# Patient Record
Sex: Female | Born: 1955 | Race: Black or African American | Hispanic: No | Marital: Married | Smoking: Never smoker
Health system: Southern US, Community
[De-identification: ages and names within clinical notes are randomized; demographics above are authoritative.]

## PROBLEM LIST (undated history)

## (undated) DIAGNOSIS — I671 Cerebral aneurysm, nonruptured: Secondary | ICD-10-CM

## (undated) DIAGNOSIS — I10 Essential (primary) hypertension: Secondary | ICD-10-CM

## (undated) DIAGNOSIS — C569 Malignant neoplasm of unspecified ovary: Secondary | ICD-10-CM

## (undated) HISTORY — PX: CHOLECYSTECTOMY: SHX55

## (undated) HISTORY — PX: CEREBRAL ANEURYSM REPAIR: SHX164

## (undated) HISTORY — PX: ABDOMINAL HYSTERECTOMY: SHX81

## (undated) HISTORY — PX: APPENDECTOMY: SHX54

---

## 2018-07-27 ENCOUNTER — Emergency Department (HOSPITAL_BASED_OUTPATIENT_CLINIC_OR_DEPARTMENT_OTHER)
Admission: EM | Admit: 2018-07-27 | Discharge: 2018-07-27 | Disposition: A | Discharge status: Home / Self Care | Payer: BLUE CROSS/BLUE SHIELD | Attending: Emergency Medicine | Admitting: Emergency Medicine

## 2018-07-27 ENCOUNTER — Emergency Department (HOSPITAL_BASED_OUTPATIENT_CLINIC_OR_DEPARTMENT_OTHER): Payer: BLUE CROSS/BLUE SHIELD

## 2018-07-27 ENCOUNTER — Encounter (HOSPITAL_BASED_OUTPATIENT_CLINIC_OR_DEPARTMENT_OTHER): Payer: Self-pay | Admitting: Emergency Medicine

## 2018-07-27 ENCOUNTER — Other Ambulatory Visit: Payer: Self-pay

## 2018-07-27 DIAGNOSIS — Z91010 Allergy to peanuts: Secondary | ICD-10-CM | POA: Insufficient documentation

## 2018-07-27 DIAGNOSIS — R519 Headache, unspecified: Secondary | ICD-10-CM

## 2018-07-27 DIAGNOSIS — I10 Essential (primary) hypertension: Secondary | ICD-10-CM | POA: Insufficient documentation

## 2018-07-27 DIAGNOSIS — Z8543 Personal history of malignant neoplasm of ovary: Secondary | ICD-10-CM | POA: Diagnosis not present

## 2018-07-27 DIAGNOSIS — R51 Headache: Secondary | ICD-10-CM | POA: Diagnosis not present

## 2018-07-27 HISTORY — DX: Cerebral aneurysm, nonruptured: I67.1

## 2018-07-27 HISTORY — DX: Malignant neoplasm of unspecified ovary: C56.9

## 2018-07-27 HISTORY — DX: Essential (primary) hypertension: I10

## 2018-07-27 LAB — BASIC METABOLIC PANEL
Anion gap: 8 (ref 5–15)
BUN: 12 mg/dL (ref 8–23)
CO2: 30 mmol/L (ref 22–32)
Calcium: 9 mg/dL (ref 8.9–10.3)
Chloride: 100 mmol/L (ref 98–111)
Creatinine, Ser: 0.66 mg/dL (ref 0.44–1.00)
GFR calc Af Amer: >60 mL/min (ref >60)
GFR calc non Af Amer: >60 mL/min (ref >60)
Glucose, Bld: 111 mg/dL — ABNORMAL HIGH (ref 70–99)
Potassium: 3.3 mmol/L — ABNORMAL LOW (ref 3.5–5.1)
Sodium: 138 mmol/L (ref 135–145)

## 2018-07-27 LAB — CBC WITH DIFFERENTIAL/PLATELET
Abs Immature Granulocytes: 0.01 10*3/uL (ref 0.00–0.07)
Basophils Absolute: 0.1 10*3/uL (ref 0.0–0.1)
Basophils Relative: 1 %
Eosinophils Absolute: 0.1 10*3/uL (ref 0.0–0.5)
Eosinophils Relative: 2 %
HCT: 41.9 % (ref 36.0–46.0)
Hemoglobin: 13.5 g/dL (ref 12.0–15.0)
Immature Granulocytes: 0 %
Lymphocytes Relative: 45 %
Lymphs Abs: 1.7 10*3/uL (ref 0.7–4.0)
MCH: 26.9 pg (ref 26.0–34.0)
MCHC: 32.2 g/dL (ref 30.0–36.0)
MCV: 83.5 fL (ref 80.0–100.0)
Monocytes Absolute: 0.7 10*3/uL (ref 0.1–1.0)
Monocytes Relative: 17 %
Neutro Abs: 1.3 10*3/uL — ABNORMAL LOW (ref 1.7–7.7)
Neutrophils Relative %: 35 %
Platelets: 251 10*3/uL (ref 150–400)
RBC: 5.02 MIL/uL (ref 3.87–5.11)
RDW: 14.1 % (ref 11.5–15.5)
WBC: 3.8 10*3/uL — ABNORMAL LOW (ref 4.0–10.5)
nRBC: 0 % (ref 0.0–0.2)

## 2018-07-27 MED ORDER — BUTALBITAL-APAP-CAFFEINE 50-325-40 MG PO TABS: 1.0000 | ORAL_TABLET | ORAL | 0 refills | Status: AC | PRN

## 2018-07-27 MED ORDER — SODIUM CHLORIDE 0.9 % IV BOLUS
1000.0000 mL | Freq: Once | INTRAVENOUS | Status: AC
  Administered 2018-07-27: 1000 mL via INTRAVENOUS

## 2018-07-27 MED ORDER — IOPAMIDOL (ISOVUE-370) INJECTION 76%
100.0000 mL | Freq: Once | INTRAVENOUS | Status: AC | PRN
  Administered 2018-07-27: 100 mL via INTRAVENOUS

## 2018-07-27 MED ORDER — KETOROLAC TROMETHAMINE 15 MG/ML IJ SOLN
15.0000 mg | Freq: Once | INTRAMUSCULAR | Status: AC
  Administered 2018-07-27: 15 mg via INTRAVENOUS
  Filled 2018-07-27: qty 1

## 2018-07-27 MED ORDER — DIPHENHYDRAMINE HCL 50 MG/ML IJ SOLN
25.0000 mg | Freq: Once | INTRAMUSCULAR | Status: AC
  Administered 2018-07-27: 25 mg via INTRAVENOUS
  Filled 2018-07-27: qty 1

## 2018-07-27 MED ORDER — METOCLOPRAMIDE HCL 5 MG/ML IJ SOLN
10.0000 mg | Freq: Once | INTRAMUSCULAR | Status: AC
  Administered 2018-07-27: 10 mg via INTRAVENOUS
  Filled 2018-07-27: qty 2

## 2018-07-27 NOTE — ED Triage Notes (Signed)
Pt c/o headache for past 1-2 months. Pt has appointment with neurosurgeon in February. Pt ran out of her pain medication 6 days ago.

## 2018-07-27 NOTE — ED Notes (Signed)
ED Provider at bedside. 

## 2018-07-27 NOTE — Discharge Instructions (Addendum)
Your CT scan today does not show any concerns findings. You received IV contrast so we could specifically evaluate your blood vessels because of your past history of cerebral aneurysm. I want you to follow-up as previously scheduled. Take prescribed medication as needed for further headaches.

## 2018-08-04 NOTE — ED Provider Notes (Signed)
[facility] Provider Note   CSN: [id number] Arrival date & time: 07/27/18  1056     History   Chief Complaint No chief complaint on file.   HPI Amber Grimes is a 63 y.o. female.  HPI   63 year old female with headache.  Gradual onset over a month ago.  Denies any acute trauma or strain.  Headache is diffuse.  Past history of aneurysm status post clipping years ago.  No acute visual changes.  No acute numbness, tingling focal loss of strength.  Reports upcoming appointment neurosurgery.  Out of her pain medication approximately a week ago.  Past Medical History:  Diagnosis Date  . Brain aneurysm   . Hypertension   . Ovarian cancer ([facility])     There are no active problems to display for this patient.   Past Surgical History:  Procedure Laterality Date  . ABDOMINAL HYSTERECTOMY    . CHOLECYSTECTOMY       OB History   No obstetric history on file.      Home Medications    Prior to Admission medications   Medication Sig Start Date End Date Taking? Authorizing Provider  butalbital-acetaminophen-caffeine (FIORICET, [facility]) [phone] MG tablet Take 1 tablet by mouth every 4 (four) hours as needed for headache. 07/27/18 07/27/19  Virgel Manifold, MD    Family History No family history on file.  Social History Social History   Tobacco Use  . Smoking status: Never Smoker  . Smokeless tobacco: Never Used  Substance Use Topics  . Alcohol use: Never    Frequency: Never  . Drug use: Never     Allergies   Peanut-containing drug products   Review of Systems Review of Systems  All systems reviewed and negative, other than as noted in HPI.  Physical Exam Updated Vital Signs BP 111/61 (BP Location: Right Arm)   Pulse 78   Temp 98.7 F (37.1 C) (Oral)   Resp 18   Ht 5\' 2"  (1.575 m)   Wt 70.3 kg   SpO2 100%   BMI 28.35 kg/m   Physical Exam Vitals signs and nursing note reviewed.  Constitutional:      General: She is not  in acute distress.    Appearance: She is well-developed.  HENT:     Head: Normocephalic and atraumatic.     Nose: Nose normal.  Eyes:     General:        Right eye: No discharge.        Left eye: No discharge.     Conjunctiva/sclera: Conjunctivae normal.     Pupils: Pupils are equal, round, and reactive to light.  Neck:     Musculoskeletal: Neck supple.  Cardiovascular:     Rate and Rhythm: Normal rate and regular rhythm.     Heart sounds: Normal heart sounds. No murmur. No friction rub. No gallop.   Pulmonary:     Effort: Pulmonary effort is normal. No respiratory distress.     Breath sounds: Normal breath sounds.  Abdominal:     General: There is no distension.     Palpations: Abdomen is soft.     Tenderness: There is no abdominal tenderness.  Musculoskeletal:        General: No tenderness.  Skin:    General: Skin is warm and dry.  Neurological:     Mental Status: She is alert and oriented to person, place, and time.     Cranial Nerves: No cranial nerve deficit.  Sensory: No sensory deficit.     Motor: No weakness.     Coordination: Coordination normal.  Psychiatric:        Behavior: Behavior normal.        Thought Content: Thought content normal.      ED Treatments / Results  Labs (all labs ordered are listed, but only abnormal results are displayed) Labs Reviewed  CBC WITH DIFFERENTIAL/PLATELET - Abnormal; Notable for the following components:      Result Value   WBC 3.8 (*)    Neutro Abs 1.3 (*)    All other components within normal limits  BASIC METABOLIC PANEL - Abnormal; Notable for the following components:   Potassium 3.3 (*)    Glucose, Bld 111 (*)    All other components within normal limits    EKG None  Radiology No results found.   Ct Angio Head W Or Wo Contrast  Result Date: 07/27/2018 CLINICAL DATA:  Headache 1 month. History of aneurysm clipping and coiling EXAM: CT ANGIOGRAPHY HEAD TECHNIQUE: Multidetector CT imaging of the head was  performed using the standard protocol during bolus administration of intravenous contrast. Multiplanar CT image reconstructions and MIPs were obtained to evaluate the vascular anatomy. CONTRAST:  174mL ISOVUE-370 IOPAMIDOL (ISOVUE-370) INJECTION 76% COMPARISON:  None. FINDINGS: CT HEAD Brain: Ventricle size normal. Small hypodensity left internal capsule anteriorly and left frontotemporal lobe appears to be due to chronic infarction. Negative for acute infarct, hemorrhage, mass. No midline shift. Vascular: Negative for hyperdense vessel. Aneurysm clip left MCA bifurcation. Aneurysm coiling right posterior communicating artery region. Skull: Left temporal craniotomy.  No acute skeletal abnormality Sinuses: Mucosal edema paranasal sinuses. Right ethmoidectomy and medial antrostomy. Extensive bony thickening of the right maxillary sinus. Air-fluid level right sphenoid sinus. Mucosal edema in the paranasal sinuses. Mastoid sinus clear bilaterally. Orbits: Negative CTA HEAD Anterior circulation: Right cavernous carotid widely patent. Aneurysm coil pack in good position in the right posterior communicating artery region. 3 mm aneurysm medial to the coil pack. Right anterior and middle cerebral arteries widely patent. Right posterior communicating artery patent. Left cavernous carotid widely patent without aneurysm or stenosis. Left anterior and middle cerebral arteries patent. Aneurysm clip in the left MCA bifurcation without recurrent aneurysm. Left posterior communicating artery patent. Posterior circulation: Right vertebral dominant. Small left vertebral artery. Both vertebral arteries contribute to the basilar. PICA patent bilaterally. Basilar widely patent. Posterior cerebral arteries patent bilaterally. No aneurysm in the posterior circulation. Venous sinuses: Negative Anatomic variants: None Delayed phase: Normal enhancement on delayed imaging. IMPRESSION: 1. Negative for subarachnoid hemorrhage. 2. 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 No acute infarct. 3. Aneurysm coiling right posterior communicating artery. Residual/recurrent 3 mm aneurysm medial to the coil pack 4. Clipping of left MCA bifurcation without recurrent aneurysm. Electronically Signed   By: Franchot Gallo M.D.   On: 07/27/2018 14:36    Procedures Procedures (including critical care time)  Medications Ordered in ED Medications  sodium chloride 0.9 % bolus 1,000 mL (0 mLs Intravenous Stopped 07/27/18 1456)  metoCLOPramide (REGLAN) injection 10 mg (10 mg Intravenous Given 07/27/18 1326)  diphenhydrAMINE (BENADRYL) injection 25 mg (25 mg Intravenous Given 07/27/18 1326)  ketorolac (TORADOL) 15 MG/ML injection 15 mg (15 mg Intravenous Given 07/27/18 1326)  iopamidol (ISOVUE-370) 76 % injection 100 mL (100 mLs Intravenous Contrast Given 07/27/18 1349)     Initial Impression / Assessment and Plan / ED Course  I have reviewed  the triage vital signs and the nursing notes.  Pertinent labs & imaging results that were available during my care of the patient were reviewed by me and considered in my medical decision making (see chart for details).     Headache which started become chronic in nature.  Afebrile.  Neuro exam is nonfocal. Suspect primary HA. Consider emergent secondary causes such as bleed, infectious or mass but doubt.  She of aneurysm, but CT today without evidence of acute process.  There is no history of trauma. Pt has a nonfocal neurological exam. Afebrile and neck supple. No use of blood thinning medication. Consider ocular etiology such as acute angle closure glaucoma but doubt. Pt denies acute change in visual acuity and eye exam unremarkable. Doubt temporal arteritis. No temporal tenderness and temporal artery pulsations palpable. Doubt CO poisoning. No contacts with similar symptoms. Doubt venous thrombosis. Doubt carotid or vertebral arteries dissection.  Symptoms improved with meds. Feel that can be safely discharged, but strict return precautions discussed. Outpt fu.   Final Clinical Impressions(s) / ED Diagnoses   Final diagnoses:  Nonintractable headache, unspecified chronicity pattern, unspecified headache type    ED Discharge Orders         Ordered    butalbital-acetaminophen-caffeine (FIORICET, [facility]) 50-325-40 MG tablet  Every 4 hours PRN     07/27/18 1450           Virgel Manifold, MD 08/04/18 [phone]

## 2020-04-07 ENCOUNTER — Other Ambulatory Visit: Payer: Self-pay

## 2020-04-07 ENCOUNTER — Emergency Department (HOSPITAL_BASED_OUTPATIENT_CLINIC_OR_DEPARTMENT_OTHER): Payer: BC Managed Care – PPO

## 2020-04-07 ENCOUNTER — Inpatient Hospital Stay (HOSPITAL_BASED_OUTPATIENT_CLINIC_OR_DEPARTMENT_OTHER)
Admission: EM | Admit: 2020-04-07 | Discharge: 2020-04-11 | DRG: 177 | Disposition: A | Discharge status: Home / Self Care | Payer: BC Managed Care – PPO | Attending: Internal Medicine | Admitting: Internal Medicine

## 2020-04-07 ENCOUNTER — Encounter (HOSPITAL_BASED_OUTPATIENT_CLINIC_OR_DEPARTMENT_OTHER): Payer: Self-pay | Admitting: *Deleted

## 2020-04-07 DIAGNOSIS — Z90710 Acquired absence of both cervix and uterus: Secondary | ICD-10-CM

## 2020-04-07 DIAGNOSIS — Z90722 Acquired absence of ovaries, bilateral: Secondary | ICD-10-CM

## 2020-04-07 DIAGNOSIS — J1282 Pneumonia due to coronavirus disease 2019: Secondary | ICD-10-CM | POA: Diagnosis present

## 2020-04-07 DIAGNOSIS — Z7951 Long term (current) use of inhaled steroids: Secondary | ICD-10-CM

## 2020-04-07 DIAGNOSIS — U071 COVID-19: Secondary | ICD-10-CM | POA: Diagnosis not present

## 2020-04-07 DIAGNOSIS — I10 Essential (primary) hypertension: Secondary | ICD-10-CM | POA: Diagnosis present

## 2020-04-07 DIAGNOSIS — Z79899 Other long term (current) drug therapy: Secondary | ICD-10-CM

## 2020-04-07 DIAGNOSIS — Z8249 Family history of ischemic heart disease and other diseases of the circulatory system: Secondary | ICD-10-CM

## 2020-04-07 DIAGNOSIS — A4189 Other specified sepsis: Secondary | ICD-10-CM | POA: Diagnosis present

## 2020-04-07 DIAGNOSIS — E876 Hypokalemia: Secondary | ICD-10-CM | POA: Diagnosis present

## 2020-04-07 DIAGNOSIS — Z8543 Personal history of malignant neoplasm of ovary: Secondary | ICD-10-CM

## 2020-04-07 DIAGNOSIS — J9601 Acute respiratory failure with hypoxia: Secondary | ICD-10-CM | POA: Diagnosis present

## 2020-04-07 DIAGNOSIS — Z91010 Allergy to peanuts: Secondary | ICD-10-CM

## 2020-04-07 LAB — RESPIRATORY PANEL BY RT PCR (FLU A&B, COVID)
Influenza A by PCR: NEGATIVE
Influenza B by PCR: NEGATIVE
SARS Coronavirus 2 by RT PCR: POSITIVE — AB

## 2020-04-07 LAB — CBC WITH DIFFERENTIAL/PLATELET
Abs Immature Granulocytes: 0.12 10*3/uL — ABNORMAL HIGH (ref 0.00–0.07)
Basophils Absolute: 0 10*3/uL (ref 0.0–0.1)
Basophils Relative: 0 %
Eosinophils Absolute: 0.2 10*3/uL (ref 0.0–0.5)
Eosinophils Relative: 2 %
HCT: 42.4 % (ref 36.0–46.0)
Hemoglobin: 14.1 g/dL (ref 12.0–15.0)
Immature Granulocytes: 1 %
Lymphocytes Relative: 12 %
Lymphs Abs: 1.4 10*3/uL (ref 0.7–4.0)
MCH: 27.4 pg (ref 26.0–34.0)
MCHC: 33.3 g/dL (ref 30.0–36.0)
MCV: 82.5 fL (ref 80.0–100.0)
Monocytes Absolute: 0.7 10*3/uL (ref 0.1–1.0)
Monocytes Relative: 6 %
Neutro Abs: 9.2 10*3/uL — ABNORMAL HIGH (ref 1.7–7.7)
Neutrophils Relative %: 79 %
Platelets: 678 10*3/uL — ABNORMAL HIGH (ref 150–400)
RBC: 5.14 MIL/uL — ABNORMAL HIGH (ref 3.87–5.11)
RDW: 13.4 % (ref 11.5–15.5)
WBC: 11.6 10*3/uL — ABNORMAL HIGH (ref 4.0–10.5)
nRBC: 0 % (ref 0.0–0.2)

## 2020-04-07 LAB — C-REACTIVE PROTEIN: CRP: 19.8 mg/dL — ABNORMAL HIGH (ref <1.0)

## 2020-04-07 LAB — LACTIC ACID, PLASMA: Lactic Acid, Venous: 1.6 mmol/L (ref 0.5–1.9)

## 2020-04-07 LAB — COMPREHENSIVE METABOLIC PANEL
ALT: 70 U/L — ABNORMAL HIGH (ref 0–44)
AST: 53 U/L — ABNORMAL HIGH (ref 15–41)
Albumin: 3.5 g/dL (ref 3.5–5.0)
Alkaline Phosphatase: 80 U/L (ref 38–126)
Anion gap: 16 — ABNORMAL HIGH (ref 5–15)
BUN: 16 mg/dL (ref 8–23)
CO2: 27 mmol/L (ref 22–32)
Calcium: 9.3 mg/dL (ref 8.9–10.3)
Chloride: 102 mmol/L (ref 98–111)
Creatinine, Ser: 0.72 mg/dL (ref 0.44–1.00)
GFR calc Af Amer: >60 mL/min (ref >60)
GFR calc non Af Amer: >60 mL/min (ref >60)
Glucose, Bld: 125 mg/dL — ABNORMAL HIGH (ref 70–99)
Potassium: 2.9 mmol/L — ABNORMAL LOW (ref 3.5–5.1)
Sodium: 145 mmol/L (ref 135–145)
Total Bilirubin: 1.3 mg/dL — ABNORMAL HIGH (ref 0.3–1.2)
Total Protein: 7.6 g/dL (ref 6.5–8.1)

## 2020-04-07 LAB — D-DIMER, QUANTITATIVE: D-Dimer, Quant: 3.95 ug/mL-FEU — ABNORMAL HIGH (ref 0.00–0.50)

## 2020-04-07 LAB — TRIGLYCERIDES: Triglycerides: 114 mg/dL (ref <150)

## 2020-04-07 LAB — PROCALCITONIN: Procalcitonin: <0.1 ng/mL

## 2020-04-07 LAB — FIBRINOGEN: Fibrinogen: >800 mg/dL — ABNORMAL HIGH (ref 210–475)

## 2020-04-07 LAB — FERRITIN: Ferritin: 1511 ng/mL — ABNORMAL HIGH (ref 11–307)

## 2020-04-07 LAB — LACTATE DEHYDROGENASE: LDH: 535 U/L — ABNORMAL HIGH (ref 98–192)

## 2020-04-07 MED ORDER — POTASSIUM CHLORIDE 10 MEQ/100ML IV SOLN
10.0000 meq | Freq: Once | INTRAVENOUS | Status: AC
  Administered 2020-04-07: 10 meq via INTRAVENOUS
  Filled 2020-04-07: qty 100

## 2020-04-07 MED ORDER — SODIUM CHLORIDE 0.9 % IV SOLN
100.0000 mg | Freq: Once | INTRAVENOUS | Status: AC
  Administered 2020-04-07: 100 mg via INTRAVENOUS
  Filled 2020-04-07: qty 20

## 2020-04-07 MED ORDER — SODIUM CHLORIDE 0.9 % IV SOLN
100.0000 mg | Freq: Every day | INTRAVENOUS | Status: AC
  Administered 2020-04-08 – 2020-04-11 (×4): 100 mg via INTRAVENOUS
  Filled 2020-04-07 (×4): qty 20

## 2020-04-07 MED ORDER — DEXAMETHASONE SODIUM PHOSPHATE 10 MG/ML IJ SOLN
6.0000 mg | Freq: Once | INTRAMUSCULAR | Status: AC
  Administered 2020-04-07: 6 mg via INTRAVENOUS
  Filled 2020-04-07: qty 1

## 2020-04-07 MED ORDER — IOHEXOL 350 MG/ML SOLN
100.0000 mL | Freq: Once | INTRAVENOUS | Status: AC | PRN
  Administered 2020-04-07: 100 mL via INTRAVENOUS

## 2020-04-07 MED ORDER — POTASSIUM CHLORIDE CRYS ER 20 MEQ PO TBCR
40.0000 meq | EXTENDED_RELEASE_TABLET | Freq: Once | ORAL | Status: AC
  Administered 2020-04-07: 40 meq via ORAL
  Filled 2020-04-07: qty 2

## 2020-04-07 NOTE — ED Notes (Signed)
Attempted to Obtain EKG, Waiting for assessments to be complete per RN

## 2020-04-07 NOTE — ED Triage Notes (Signed)
Covid Positive. She was seen by her MD and was told she needed to come to the ED with "blood poisoning". She had blood work and a CXR.

## 2020-04-07 NOTE — ED Notes (Signed)
Pt was placed on oxygen at 4l/m to wait for a room. Her oxygen sats at 94% on oxygen.

## 2020-04-07 NOTE — ED Notes (Signed)
ED Provider at bedside. 

## 2020-04-07 NOTE — ED Notes (Signed)
Unable to get sufficient blood return on second IV for second blood culture. EDP notified.

## 2020-04-07 NOTE — ED Provider Notes (Signed)
[facility]PARTMENT Provider Note   CSN: [id number] Arrival date & time: 04/07/20  1150     History Chief Complaint  Patient presents with  . Shortness of Breath    COVID+    Kimla Furth is a 64 y.o. female past history of brain injury, hypertension, ovarian evaluation of shortness of breath and abnormal lab work.  She reports that she has had symptoms since 03/26/2020.  On 04/02/20, she was evaluated at urgent care and was Covid positive.  Patient reports that she was having some worsening shortness of breath that she checked and followed up with her primary care doctor today.  She reports that they did blood work and called her and told her to go to the emergency department because "she had blood poisoning."  She states that she does not know what that means.  She states that over the last 4 days, she has shortness of breath.  She states that she does not move and stay still, she feels better but as soon as she starts moving around, she starts having worsening trouble breathing.  She does not have any history of asthma or COPD.  Denies any inhaler use.  She does not smoke.  She states she is not been having any chest pain.  She does report that she has had decreased appetite and has not been wanting to eat or drink anything.  She is on a vomiting, abdominal pain. She denies any OCP use, recent immobilization, prior history of DVT/PE, recent surgery, leg swelling, or long travel.  The history is provided by the patient.       Past Medical History:  Diagnosis Date  . Brain aneurysm   . Hypertension   . Ovarian cancer [facility])     Patient Active Problem List   Diagnosis Date Noted  . COVID-19 04/07/2020    Past Surgical History:  Procedure Laterality Date  . ABDOMINAL HYSTERECTOMY    . CHOLECYSTECTOMY       OB History   No obstetric history on file.     No family history on file.  Social History   Tobacco Use  . Smoking status: Never Smoker  .  Smokeless tobacco: Never Used  Vaping Use  . Vaping Use: Never used  Substance Use Topics  . Alcohol use: Never  . Drug use: Never    Home Medications Prior to Admission medications   Medication Sig Start Date End Date Taking? Authorizing Provider  albuterol (PROVENTIL) (2.5 MG/3ML) 0.083% nebulizer solution Inhale into the lungs. 04/07/20 04/21/20 Yes [provider]  benzonatate (TESSALON) 200 MG capsule Take by mouth. 04/07/20 04/21/20 Yes [provider]  budesonide (PULMICORT) 1 MG/2ML nebulizer solution Inhale into the lungs. 04/07/20 04/21/20 Yes [provider]  clotrimazole (MYCELEX) 10 MG troche Take by mouth. 04/07/20 04/14/20 Yes [provider]  dexamethasone (DECADRON) 6 MG tablet Take by mouth. 04/02/20 04/12/20 Yes [provider]  dextromethorphan-guaiFENesin (MUCINEX DM) 30-600 MG 12hr tablet Take by mouth. 04/07/20 04/21/20 Yes [provider]  furosemide (LASIX) 40 MG tablet TAKE 1 TABLET(40 MG) BY MOUTH DAILY AS NEEDED 02/19/17  Yes [provider]  HYDROcodone-homatropine (HYCODAN) 5-1.5 MG/5ML syrup Take by mouth. 04/07/20 04/17/20 Yes [provider]  losartan (COZAAR) 100 MG tablet TAKE 1 TABLET(100 MG) BY MOUTH DAILY 02/19/17  Yes [provider]  montelukast (SINGULAIR) 10 MG tablet Take by mouth. 04/07/20 04/21/20 Yes [provider]  Nebulizers (AEROECLIPSE II NEBULIZER) MISC See admin instructions.  04/07/20  Yes [provider]    Allergies    Peanut-containing drug products  Review of Systems   Review of Systems  Constitutional: Positive for appetite change and fatigue. Negative for fever.  Respiratory: Positive for cough and shortness of breath.   Cardiovascular: Negative for chest pain and leg swelling.  Gastrointestinal: Negative for abdominal pain, nausea and vomiting.  Genitourinary: Negative for dysuria and hematuria.  Neurological: Negative for headaches.  All  other systems reviewed and are negative.   Physical Exam Updated Vital Signs BP 133/73   Pulse 66   Temp 98.6 F (37 C) (Oral)   Resp (!) 22   Ht 5\' 2"  (1.575 m)   Wt 72.6 kg   SpO2 100%   BMI 29.26 kg/m   Physical Exam Vitals and nursing note reviewed.  Constitutional:      Appearance: Normal appearance. She is well-developed. She is ill-appearing.  HENT:     Head: Normocephalic and atraumatic.  Eyes:     General: Lids are normal.     Conjunctiva/sclera: Conjunctivae normal.     Pupils: Pupils are equal, round, and reactive to light.  Cardiovascular:     Rate and Rhythm: Normal rate and regular rhythm.     Pulses: Normal pulses.     Heart sounds: Normal heart sounds. No murmur heard.  No friction rub. No gallop.   Pulmonary:     Effort: Tachypnea present.     Comments: Tachypnea and increased work of breathing noted.  Patient speaking in medium sentences.  Lungs show diffuse rales noted throughout all lung bases.  No obvious wheezing. Abdominal:     Palpations: Abdomen is soft. Abdomen is not rigid.     Tenderness: There is no abdominal tenderness. There is no guarding.     Comments: Abdomen is soft, non-distended, non-tender. No rigidity, No guarding. No peritoneal signs.  Musculoskeletal:        General: Normal range of motion.     Cervical back: Full passive range of motion without pain.     Comments: BLE are symmetric in appearance without any overlying warmth, erythema, or edema.   Skin:    General: Skin is warm and dry.     Capillary Refill: Capillary refill takes less than 2 seconds.  Neurological:     Mental Status: She is alert and oriented to person, place, and time.  Psychiatric:        Speech: Speech normal.     ED Results / Procedures / Treatments   Labs (all labs ordered are listed, but only abnormal results are displayed) Labs Reviewed  RESPIRATORY PANEL BY RT PCR (FLU A&B, COVID) - Abnormal; Notable for the following components:      Result  Value   SARS Coronavirus 2 by RT PCR POSITIVE (*)    All other components within normal limits  CBC WITH DIFFERENTIAL/PLATELET - Abnormal; Notable for the following components:   WBC 11.6 (*)    RBC 5.14 (*)    Platelets 678 (*)    Neutro Abs 9.2 (*)    Abs Immature Granulocytes 0.12 (*)    All other components within normal limits  COMPREHENSIVE METABOLIC PANEL - Abnormal; Notable for the following components:   Potassium 2.9 (*)    Glucose, Bld 125 (*)    AST 53 (*)    ALT 70 (*)    Total Bilirubin 1.3 (*)    Anion gap 16 (*)    All other components within normal limits  D-DIMER, QUANTITATIVE (NOT AT [facility]) - Abnormal; Notable for the following components:   D-Dimer, Quant 3.95 (*)    All other components within normal limits  LACTATE DEHYDROGENASE - Abnormal; Notable for the following components:   LDH 535 (*)    All other components within normal limits  FERRITIN - Abnormal; Notable for the following components:   Ferritin 1,511 (*)    All other components within normal limits  FIBRINOGEN - Abnormal; Notable for the following components:   Fibrinogen >800 (*)    All other components within normal limits  C-REACTIVE PROTEIN - Abnormal; Notable for the following components:   CRP 19.8 (*)    All other components within normal limits  CULTURE, BLOOD (ROUTINE X 2)  CULTURE, BLOOD (ROUTINE X 2)  LACTIC ACID, PLASMA  PROCALCITONIN  TRIGLYCERIDES    EKG None  Radiology CT Angio Chest PE W and/or Wo Contrast  Result Date: 04/07/2020 CLINICAL DATA:  Pulmonary embolus suspected. COVID positive with shortness of breath and positive D-dimer. EXAM: CT ANGIOGRAPHY CHEST WITH CONTRAST TECHNIQUE: Multidetector CT imaging of the chest was performed using the standard protocol during bolus administration of intravenous contrast. Multiplanar CT image reconstructions and MIPs were obtained to evaluate the vascular anatomy. CONTRAST:  149mL OMNIPAQUE IOHEXOL 350 MG/ML SOLN COMPARISON:   Chest x-ray 04/07/2020. FINDINGS: Cardiovascular: Satisfactory opacification of the pulmonary arteries to the segmental level. No evidence of pulmonary embolism. The main pulmonary artery is normal in caliber. Foci of gas within the main pulmonary artery likely related to injection of intravenous contrast. Normal heart size. Trace pericardial effusion. The thoracic aorta is normal in caliber. Mild aorta atherosclerotic calcification. Mediastinum/Nodes: Prominent borderline enlarged hilar lymph nodes likely reactive. No definite enlarged mediastinal lymph node. No enlarged axillary lymph nodes. Thyroid gland, trachea, and esophagus demonstrate no significant findings. Lungs/Pleura: Diffuse scattered ground-glass airspace opacities that are more consolidative within the lower lung zones. No definite pulmonary mass within the aerated portions of the lungs. Upper Abdomen: No acute abnormality. Musculoskeletal: No chest wall abnormality No suspicious lytic or blastic osseous lesions. No acute displaced fracture. Multilevel degenerative changes of the spine. Review of the MIP images confirms the above findings. IMPRESSION: 1. No pulmonary embolus. 2. Diffuse multifocal pneumonia consistent with known COVID 19 infection. 3. Other imaging findings of potential clinical significance: Likely reactive hilar lymph nodes. Trace pericardial effusion. Aortic Atherosclerosis (ICD10-I70.0). Electronically Signed   By: Iven Finn M.D.   On: 04/07/2020 15:47   DG Chest Port 1 View  Result Date: 04/07/2020 CLINICAL DATA:  Shortness of breath and cough.  COVID-19 positive EXAM: PORTABLE CHEST 1 VIEW COMPARISON:  April 07, 2020 study obtained earlier in the day FINDINGS: There is airspace opacity in each mid and lower lung region, most notable in the bases. No new opacity evident. Heart size and pulmonary vascularity are normal. No adenopathy. No bone lesions. IMPRESSION: Multifocal opacity bilaterally consistent with  atypical organism pneumonia. No new opacity compared to earlier in the day. Cardiac silhouette within normal limits. Electronically Signed   By: Lowella Grip III M.D.   On: 04/07/2020 13:52    Procedures .Critical Care Performed by: Volanda Napoleon, PA-C Authorized by: Volanda Napoleon, PA-C   Critical care provider statement:    Critical care time (minutes):  35   Critical care was necessary to treat or prevent imminent or life-threatening deterioration of the following conditions:  Respiratory failure   Critical care was time spent personally by me on the following  activities:  Discussions with consultants, evaluation of patient's response to treatment, examination of patient, ordering and performing treatments and interventions, ordering and review of laboratory studies, ordering and review of radiographic studies, pulse oximetry, re-evaluation of patient's condition, obtaining history from patient or surrogate and review of old charts   (including critical care time)  Medications Ordered in ED Medications  remdesivir 100 mg in sodium chloride 0.9 % 100 mL IVPB (has no administration in time range)  dexamethasone (DECADRON) injection 6 mg (6 mg Intravenous Given 04/07/20 1407)  iohexol (OMNIPAQUE) 350 MG/ML injection 100 mL (100 mLs Intravenous Contrast Given 04/07/20 1511)  potassium chloride 10 mEq in 100 mL IVPB (0 mEq Intravenous Stopped 04/07/20 1729)  remdesivir 100 mg in sodium chloride 0.9 % 100 mL IVPB (0 mg Intravenous Stopped 04/07/20 1743)    Followed by  remdesivir 100 mg in sodium chloride 0.9 % 100 mL IVPB (0 mg Intravenous Stopped 04/07/20 1815)  potassium chloride SA (KLOR-CON) CR tablet 40 mEq (40 mEq Oral Given 04/07/20 1713)    ED Course  I have reviewed the triage vital signs and the nursing notes.  Pertinent labs & imaging results that were available during my care of the patient were reviewed by me and considered in my medical decision making (see chart for  details).    MDM Rules/Calculators/A&P                          64 year old female approximately her brain aneurysm, hypertension, ovarian cancer presents for evaluation of abnormal blood work, shortness of breath.  Diagnosed with Covid on 04/02/20.  Reports she had been having some worsening shortness of breath.  Follow-up with her PCP and was called today and told that she had "blood poisoning" and was told to come to emergency department.  She reports her last 4 days, shortness of breath has worsened.  On initially arrival, she is afebrile but was hypoxic with O2 sats of 85% on room air.  She was placed on 5 L which brought her up to 94%.  During my evaluation, I turned the oxygen off and just talking and sitting on the bed, she dropped back down to 85.  Patient be placed back on 5 L of O2.  She has no oxygen requirement at home.  She does have diffuse rales noted in all lung fields.  No wheezing.  We will plan repeat chest x-ray, blood work.  I reviewed the blood work from her primary care doctor this morning.  She had low potassium as well as an elevated D-dimer.  I suspect this is the abnormalities he was referring  Chest x-ray shows multifocal pneumonia.  CMP potassium of 2.9.  BUN/creatinine within normal limits.  D-dimer is positive.  CBC shows slight leukocytosis.  Lactic is within normal limits.  Patient is Covid positive here in the ED.  Given her positive D-dimer, plan for CTA.  Patient given repletion for potassium.  CTA shows no pulmonary embolism.  There is diffuse multifocal pneumonia consistent with known COVID-19 infection.  Given patient's hypoxia here in the ED, will plan for admission.  Patient started on Decadron, and does severe.  Discussed patient with Dr. Roosevelt Locks [facility]) who accepts patient for admission.   Updated patient on plan. She is agreeable.   Updated patient's husband Mellina Benison on plan. He can be reached at [phone].   Myan Locatelli was  evaluated in Emergency Department on 04/07/2020 for the symptoms described in  the history of present illness. She was evaluated in the context of the global COVID-19 pandemic, which necessitated consideration that the patient might be at risk for infection with the SARS-CoV-2 virus that causes COVID-19. Institutional protocols and algorithms that pertain to the evaluation of patients at risk for COVID-19 are in a state of rapid change based on information released by regulatory bodies including the CDC and federal and state organizations. These policies and algorithms were followed during the patient's care in the ED.  Portions of this note were generated with [employer]. Dictation errors may occur despite best attempts at proofreading.   Final Clinical Impression(s) / ED Diagnoses Final diagnoses:  COVID-19  Pneumonia due to COVID-19 virus  Hypokalemia    Rx / DC Orders ED Discharge Orders    None       Volanda Napoleon, PA-C 04/07/20 1859    Maudie Flakes, MD 04/08/20 [phone]

## 2020-04-08 ENCOUNTER — Encounter (HOSPITAL_COMMUNITY): Payer: Self-pay | Admitting: Internal Medicine

## 2020-04-08 DIAGNOSIS — Z79899 Other long term (current) drug therapy: Secondary | ICD-10-CM | POA: Diagnosis not present

## 2020-04-08 DIAGNOSIS — Z90722 Acquired absence of ovaries, bilateral: Secondary | ICD-10-CM | POA: Diagnosis not present

## 2020-04-08 DIAGNOSIS — Z7951 Long term (current) use of inhaled steroids: Secondary | ICD-10-CM | POA: Diagnosis not present

## 2020-04-08 DIAGNOSIS — E876 Hypokalemia: Secondary | ICD-10-CM

## 2020-04-08 DIAGNOSIS — Z8543 Personal history of malignant neoplasm of ovary: Secondary | ICD-10-CM | POA: Diagnosis not present

## 2020-04-08 DIAGNOSIS — U071 COVID-19: Principal | ICD-10-CM

## 2020-04-08 DIAGNOSIS — J9601 Acute respiratory failure with hypoxia: Secondary | ICD-10-CM

## 2020-04-08 DIAGNOSIS — A4189 Other specified sepsis: Secondary | ICD-10-CM | POA: Diagnosis present

## 2020-04-08 DIAGNOSIS — J1282 Pneumonia due to coronavirus disease 2019: Secondary | ICD-10-CM

## 2020-04-08 DIAGNOSIS — Z90710 Acquired absence of both cervix and uterus: Secondary | ICD-10-CM | POA: Diagnosis not present

## 2020-04-08 DIAGNOSIS — Z8249 Family history of ischemic heart disease and other diseases of the circulatory system: Secondary | ICD-10-CM | POA: Diagnosis not present

## 2020-04-08 DIAGNOSIS — I10 Essential (primary) hypertension: Secondary | ICD-10-CM | POA: Diagnosis present

## 2020-04-08 DIAGNOSIS — Z91010 Allergy to peanuts: Secondary | ICD-10-CM | POA: Diagnosis not present

## 2020-04-08 LAB — COMPREHENSIVE METABOLIC PANEL
ALT: 60 U/L — ABNORMAL HIGH (ref 0–44)
AST: 37 U/L (ref 15–41)
Albumin: 3.5 g/dL (ref 3.5–5.0)
Alkaline Phosphatase: 79 U/L (ref 38–126)
Anion gap: 14 (ref 5–15)
BUN: 16 mg/dL (ref 8–23)
CO2: 21 mmol/L — ABNORMAL LOW (ref 22–32)
Calcium: 8.9 mg/dL (ref 8.9–10.3)
Chloride: 108 mmol/L (ref 98–111)
Creatinine, Ser: 0.62 mg/dL (ref 0.44–1.00)
GFR calc Af Amer: >60 mL/min (ref >60)
GFR calc non Af Amer: >60 mL/min (ref >60)
Glucose, Bld: 107 mg/dL — ABNORMAL HIGH (ref 70–99)
Potassium: 3.4 mmol/L — ABNORMAL LOW (ref 3.5–5.1)
Sodium: 143 mmol/L (ref 135–145)
Total Bilirubin: 1.1 mg/dL (ref 0.3–1.2)
Total Protein: 7 g/dL (ref 6.5–8.1)

## 2020-04-08 LAB — CBC WITH DIFFERENTIAL/PLATELET
Abs Immature Granulocytes: 0.13 10*3/uL — ABNORMAL HIGH (ref 0.00–0.07)
Basophils Absolute: 0 10*3/uL (ref 0.0–0.1)
Basophils Relative: 0 %
Eosinophils Absolute: 0 10*3/uL (ref 0.0–0.5)
Eosinophils Relative: 0 %
HCT: 41 % (ref 36.0–46.0)
Hemoglobin: 13.4 g/dL (ref 12.0–15.0)
Immature Granulocytes: 1 %
Lymphocytes Relative: 13 %
Lymphs Abs: 1.5 10*3/uL (ref 0.7–4.0)
MCH: 27.6 pg (ref 26.0–34.0)
MCHC: 32.7 g/dL (ref 30.0–36.0)
MCV: 84.4 fL (ref 80.0–100.0)
Monocytes Absolute: 0.7 10*3/uL (ref 0.1–1.0)
Monocytes Relative: 6 %
Neutro Abs: 9.1 10*3/uL — ABNORMAL HIGH (ref 1.7–7.7)
Neutrophils Relative %: 80 %
Platelets: 586 10*3/uL — ABNORMAL HIGH (ref 150–400)
RBC: 4.86 MIL/uL (ref 3.87–5.11)
RDW: 13.8 % (ref 11.5–15.5)
WBC: 11.4 10*3/uL — ABNORMAL HIGH (ref 4.0–10.5)
nRBC: 0 % (ref 0.0–0.2)

## 2020-04-08 LAB — HIV ANTIBODY (ROUTINE TESTING W REFLEX): HIV Screen 4th Generation wRfx: NONREACTIVE

## 2020-04-08 LAB — C-REACTIVE PROTEIN: CRP: 13.5 mg/dL — ABNORMAL HIGH (ref <1.0)

## 2020-04-08 LAB — D-DIMER, QUANTITATIVE: D-Dimer, Quant: 3.57 ug/mL-FEU — ABNORMAL HIGH (ref 0.00–0.50)

## 2020-04-08 MED ORDER — BUDESONIDE 0.5 MG/2ML IN SUSP: 1.0000 mg | Freq: Every day | RESPIRATORY_TRACT | Status: DC | PRN

## 2020-04-08 MED ORDER — BENZONATATE 100 MG PO CAPS
200.0000 mg | ORAL_CAPSULE | Freq: Two times a day (BID) | ORAL | Status: DC | PRN
  Administered 2020-04-08: 200 mg via ORAL
  Filled 2020-04-08: qty 2

## 2020-04-08 MED ORDER — ENOXAPARIN SODIUM 40 MG/0.4ML ~~LOC~~ SOLN
40.0000 mg | SUBCUTANEOUS | Status: DC
  Administered 2020-04-08 – 2020-04-10 (×3): 40 mg via SUBCUTANEOUS
  Filled 2020-04-08 (×4): qty 0.4

## 2020-04-08 MED ORDER — ONDANSETRON HCL 4 MG PO TABS: 4.0000 mg | ORAL_TABLET | Freq: Four times a day (QID) | ORAL | Status: DC | PRN

## 2020-04-08 MED ORDER — METHYLPREDNISOLONE SODIUM SUCC 40 MG IJ SOLR
0.5000 mg/kg | Freq: Two times a day (BID) | INTRAMUSCULAR | Status: AC
  Administered 2020-04-08 – 2020-04-10 (×6): 36.4 mg via INTRAVENOUS
  Filled 2020-04-08 (×6): qty 1

## 2020-04-08 MED ORDER — GUAIFENESIN-DM 100-10 MG/5ML PO SYRP
10.0000 mL | ORAL_SOLUTION | ORAL | Status: DC | PRN
  Administered 2020-04-10: 10 mL via ORAL
  Filled 2020-04-08: qty 10

## 2020-04-08 MED ORDER — FLUTICASONE PROPIONATE HFA 220 MCG/ACT IN AERO
1.0000 | INHALATION_SPRAY | Freq: Two times a day (BID) | RESPIRATORY_TRACT | Status: DC | PRN
  Filled 2020-04-08: qty 12

## 2020-04-08 MED ORDER — ACETAMINOPHEN 325 MG PO TABS
650.0000 mg | ORAL_TABLET | Freq: Four times a day (QID) | ORAL | Status: DC | PRN
  Administered 2020-04-08: 650 mg via ORAL
  Filled 2020-04-08: qty 2

## 2020-04-08 MED ORDER — LOPERAMIDE HCL 2 MG PO CAPS: 2.0000 mg | ORAL_CAPSULE | ORAL | Status: DC | PRN

## 2020-04-08 MED ORDER — ONDANSETRON HCL 4 MG/2ML IJ SOLN: 4.0000 mg | Freq: Four times a day (QID) | INTRAMUSCULAR | Status: DC | PRN

## 2020-04-08 MED ORDER — ALBUTEROL SULFATE (2.5 MG/3ML) 0.083% IN NEBU: 2.5000 mg | INHALATION_SOLUTION | RESPIRATORY_TRACT | Status: DC | PRN

## 2020-04-08 MED ORDER — POTASSIUM CHLORIDE CRYS ER 20 MEQ PO TBCR
20.0000 meq | EXTENDED_RELEASE_TABLET | Freq: Two times a day (BID) | ORAL | Status: AC
  Administered 2020-04-08 – 2020-04-09 (×4): 20 meq via ORAL
  Filled 2020-04-08 (×4): qty 1

## 2020-04-08 MED ORDER — HYDROCOD POLST-CPM POLST ER 10-8 MG/5ML PO SUER: 5.0000 mL | Freq: Two times a day (BID) | ORAL | Status: DC | PRN

## 2020-04-08 MED ORDER — ALBUTEROL SULFATE HFA 108 (90 BASE) MCG/ACT IN AERS
1.0000 | INHALATION_SPRAY | RESPIRATORY_TRACT | Status: DC | PRN
  Filled 2020-04-08: qty 6.7

## 2020-04-08 MED ORDER — PREDNISONE 20 MG PO TABS
50.0000 mg | ORAL_TABLET | Freq: Every day | ORAL | Status: DC
  Administered 2020-04-11: 50 mg via ORAL
  Filled 2020-04-08: qty 2

## 2020-04-08 MED ORDER — SODIUM CHLORIDE 0.9 % IV SOLN
INTRAVENOUS | Status: DC | PRN
  Administered 2020-04-08: 250 mL via INTRAVENOUS

## 2020-04-08 MED ORDER — LOSARTAN POTASSIUM 50 MG PO TABS
100.0000 mg | ORAL_TABLET | Freq: Every day | ORAL | Status: DC
  Administered 2020-04-08 – 2020-04-11 (×4): 100 mg via ORAL
  Filled 2020-04-08 (×4): qty 2

## 2020-04-08 NOTE — Plan of Care (Signed)
Plan of care discussed.   

## 2020-04-08 NOTE — H&P (Signed)
History and Physical    Amber Grimes TIW:[id number] DOB: 26-Jun-1956 DOA: 04/07/2020  PCP: Elisabeth Cara, PA-C  Patient coming from: Home  I have personally briefly reviewed patient's old medical records in [place]  Chief Complaint: COVID  HPI: Amber Grimes is a 64 y.o. female with medical history significant of HTN, brain aneurysm s/p clipping, ovarian CA s/p TAH/BSO in remission.  Pt presents to ED with c/o symptoms of SOB, cough.  Symptoms onset 9/18.  Seen at [facility] 9/25, diagnosed with COVID.  Symptoms continued to progressively worsen so she came in to ED today.  SOB worse with activity.  No fever or chills.  Does have poor PO intake.  No h/o DVT/PE.   ED Course: CTA chest neg for PE, does show multifocal COVID PNA.  Pt with new 4L O2 requirement.  Started on remdesivir and decadron and sent to [facility] for admission.   Review of Systems: As per HPI, otherwise all review of systems negative.  Past Medical History:  Diagnosis Date   Brain aneurysm    Hypertension    Ovarian cancer [facility])     Past Surgical History:  Procedure Laterality Date   ABDOMINAL HYSTERECTOMY     Laproscopic radical with node biopsy   APPENDECTOMY     CEREBRAL ANEURYSM REPAIR     CHOLECYSTECTOMY       reports that she has never smoked. She has never used smokeless tobacco. She reports that she does not drink alcohol and does not use drugs.  Allergies  Allergen Reactions   Other Rash   Peanut Oil Rash   Peanut-Containing Drug Products Rash    Family History  Problem Relation Age of Onset   Hypertension Mother    Cancer Father    Hypertension Father    Glaucoma Neg Hx    Macular degeneration Neg Hx      Prior to Admission medications   Medication Sig Start Date End Date Taking? Authorizing Provider  albuterol (PROVENTIL) (2.5 MG/3ML) 0.083% nebulizer solution Inhale 2.5 mg into the lungs every 4 (four) hours as needed for wheezing or shortness of  breath.  04/07/20 04/21/20 Yes [provider]  benzonatate (TESSALON) 200 MG capsule Take 200 mg by mouth 2 (two) times daily as needed for cough.  04/07/20 04/21/20 Yes [provider]  budesonide (PULMICORT) 1 MG/2ML nebulizer solution Inhale 1 mg into the lungs daily as needed (sob).  04/07/20 04/21/20 Yes [provider]  furosemide (LASIX) 40 MG tablet Take 40 mg by mouth daily as needed for fluid.  02/19/17  Yes [provider]  losartan (COZAAR) 100 MG tablet Take 100 mg by mouth daily.  02/19/17  Yes [provider]  Nebulizers (AEROECLIPSE II NEBULIZER) MISC See admin instructions. 04/07/20  Yes [provider]    Physical Exam: Vitals:   04/07/20 2230 04/07/20 2300 04/08/20 0026 04/08/20 0227  BP: (!) 156/71 135/73 (!) 159/79 (!) 146/70  Pulse: 74 70 83 65  Resp: 19 (!) 26 (!) 28 20  Temp:   98.2 F (36.8 C) 97.9 F (36.6 C)  TempSrc:      SpO2: 93% 94% 97% 92%  Weight:      Height:        Constitutional: NAD, calm, comfortable Eyes: PERRL, lids and conjunctivae normal ENMT: Mucous membranes are moist. Posterior pharynx clear of any exudate or lesions.Normal dentition.  Neck: normal, supple, no masses, no thyromegaly Respiratory: clear to auscultation bilaterally, no wheezing, no crackles. Normal  respiratory effort. No accessory muscle use.  Cardiovascular: Regular rate and rhythm, no murmurs / rubs / gallops. No extremity edema. 2+ pedal pulses. No carotid bruits.  Abdomen: no tenderness, no masses palpated. No hepatosplenomegaly. Bowel sounds positive.  Musculoskeletal: no clubbing / cyanosis. No joint deformity upper and lower extremities. Good ROM, no contractures. Normal muscle tone.  Skin: no rashes, lesions, ulcers. No induration Neurologic: CN 2-12 grossly intact. Sensation intact, DTR normal. Strength 5/5 in all 4.  Psychiatric: Normal judgment and insight. Alert and oriented x 3. Normal mood.    Labs on  Admission: I have personally reviewed following labs and imaging studies  CBC: Recent Labs  Lab 04/07/20 1405  WBC 11.6*  NEUTROABS 9.2*  HGB 14.1  HCT 42.4  MCV 82.5  PLT 161*   Basic Metabolic Panel: Recent Labs  Lab 04/07/20 1405  NA 145  K 2.9*  CL 102  CO2 27  GLUCOSE 125*  BUN 16  CREATININE 0.72  CALCIUM 9.3   GFR: Estimated Creatinine Clearance: 67.2 mL/min (by C-G formula based on SCr of 0.72 mg/dL). Liver Function Tests: Recent Labs  Lab 04/07/20 1405  AST 53*  ALT 70*  ALKPHOS 80  BILITOT 1.3*  PROT 7.6  ALBUMIN 3.5   No results for input(s): LIPASE, AMYLASE in the last 168 hours. No results for input(s): AMMONIA in the last 168 hours. Coagulation Profile: No results for input(s): INR, PROTIME in the last 168 hours. Cardiac Enzymes: No results for input(s): CKTOTAL, CKMB, CKMBINDEX, TROPONINI in the last 168 hours. BNP (last 3 results) No results for input(s): PROBNP in the last 8760 hours. HbA1C: No results for input(s): HGBA1C in the last 72 hours. CBG: No results for input(s): GLUCAP in the last 168 hours. Lipid Profile: Recent Labs    04/07/20 1405  TRIG 114   Thyroid Function Tests: No results for input(s): TSH, T4TOTAL, FREET4, T3FREE, THYROIDAB in the last 72 hours. Anemia Panel: Recent Labs    04/07/20 1405  FERRITIN 1,511*   Urine analysis: No results found for: COLORURINE, APPEARANCEUR, LABSPEC, PHURINE, GLUCOSEU, HGBUR, BILIRUBINUR, KETONESUR, PROTEINUR, UROBILINOGEN, NITRITE, LEUKOCYTESUR  Radiological Exams on Admission: CT Angio Chest PE W and/or Wo Contrast  Result Date: 04/07/2020 CLINICAL DATA:  Pulmonary embolus suspected. COVID positive with shortness of breath and positive D-dimer. EXAM: CT ANGIOGRAPHY CHEST WITH CONTRAST TECHNIQUE: Multidetector CT imaging of the chest was performed using the standard protocol during bolus administration of intravenous contrast. Multiplanar CT image reconstructions and MIPs were  obtained to evaluate the vascular anatomy. CONTRAST:  161mL OMNIPAQUE IOHEXOL 350 MG/ML SOLN COMPARISON:  Chest x-ray 04/07/2020. FINDINGS: Cardiovascular: Satisfactory opacification of the pulmonary arteries to the segmental level. No evidence of pulmonary embolism. The main pulmonary artery is normal in caliber. Foci of gas within the main pulmonary artery likely related to injection of intravenous contrast. Normal heart size. Trace pericardial effusion. The thoracic aorta is normal in caliber. Mild aorta atherosclerotic calcification. Mediastinum/Nodes: Prominent borderline enlarged hilar lymph nodes likely reactive. No definite enlarged mediastinal lymph node. No enlarged axillary lymph nodes. Thyroid gland, trachea, and esophagus demonstrate no significant findings. Lungs/Pleura: Diffuse scattered ground-glass airspace opacities that are more consolidative within the lower lung zones. No definite pulmonary mass within the aerated portions of the lungs. Upper Abdomen: No acute abnormality. Musculoskeletal: No chest wall abnormality No suspicious lytic or blastic osseous lesions. No acute displaced fracture. Multilevel degenerative changes of the spine. Review of the MIP images confirms the above findings. IMPRESSION: 1. No  pulmonary embolus. 2. Diffuse multifocal pneumonia consistent with known COVID 19 infection. 3. Other imaging findings of potential clinical significance: Likely reactive hilar lymph nodes. Trace pericardial effusion. Aortic Atherosclerosis (ICD10-I70.0). Electronically Signed   By: Iven Finn M.D.   On: 04/07/2020 15:47   DG Chest Port 1 View  Result Date: 04/07/2020 CLINICAL DATA:  Shortness of breath and cough.  COVID-19 positive EXAM: PORTABLE CHEST 1 VIEW COMPARISON:  April 07, 2020 study obtained earlier in the day FINDINGS: There is airspace opacity in each mid and lower lung region, most notable in the bases. No new opacity evident. Heart size and pulmonary vascularity are  normal. No adenopathy. No bone lesions. IMPRESSION: Multifocal opacity bilaterally consistent with atypical organism pneumonia. No new opacity compared to earlier in the day. Cardiac silhouette within normal limits. Electronically Signed   By: Lowella Grip III M.D.   On: 04/07/2020 13:52    EKG: Independently reviewed.  Assessment/Plan Principal Problem:   Acute hypoxemic respiratory failure due to COVID-19 [facility]) Active Problems:   HTN (hypertension)    1. COVID-19 with new O2 requirement - 1. COVID pathway 2. remdesivir 3. Solumedrol 4. Daily labs 5. Replacing K, repeat in AM 6. immodium for diarrhea 7. Cont pulse ox 8. Cough suppression 2. HTN - 1. Cont losartan 2. Holding lasix  DVT prophylaxis: Lovenox Code Status: Full Family Communication: no family in room Disposition Plan: Home after O2 requirement improved Consults called: none Admission status: Admit to inpatient  Severity of Illness: The appropriate patient status for this patient is INPATIENT. Inpatient status is judged to be reasonable and necessary in order to provide the required intensity of service to ensure the patient's safety. The patient's presenting symptoms, physical exam findings, and initial radiographic and laboratory data in the context of their chronic comorbidities is felt to place them at high risk for further clinical deterioration. Furthermore, it is not anticipated that the patient will be medically stable for discharge from the hospital within 2 midnights of admission. The following factors support the patient status of inpatient.   IP status due to new O2 requirement with COVID-19 PNA.   * I certify that at the point of admission it is my clinical judgment that the patient will require inpatient hospital care spanning beyond 2 midnights from the point of admission due to high intensity of service, high risk for further deterioration and high frequency of surveillance  required.*    Chrislyn Seedorf M. DO [facility]  How to contact the [facility] Attending or Consulting provider [place] or covering provider during after hours [place], for this patient?  1. Check the care team in [facility] and look for a) attending/consulting [facility] provider listed and b) the [facility] team listed 2. Log into [email or web address]  Amion Physician Scheduling and messaging for groups and whole hospitals  On call and physician scheduling software for group practices, residents, hospitalists and other medical providers for call, clinic, rotation and shift schedules. On[employer] is a hospital-wide system for scheduling doctors and paging doctors on call. EasyPlot is for scientific plotting and data analysis.  [email or web address]  and use [place]'s universal password to access. If you do not have the password, please contact the hospital operator.  3. Locate the [facility] provider you are looking for under [facility] and page to a number that you can be directly reached. 4. If you still have difficulty reaching the provider, please page the [facility] (Director on Call) for the Hospitalists listed on  amion for assistance.  04/08/2020, 2:54 AM

## 2020-04-08 NOTE — Progress Notes (Signed)
Patient seen and examined.  Admitted by nighttime hospitalist early morning hours with symptomatic for more than 1 week.  Positive test on 9/25.  She feels slightly better than on presentation.  Not mobilized yet.  Continue IV steroids and remdesivir therapy.  Start mobilizing.

## 2020-04-08 NOTE — Progress Notes (Signed)
   04/08/20 0026  Assess: MEWS Score  Temp 98.2 F (36.8 C)  BP (!) 159/79  Pulse Rate 83  Resp (!) 28  Level of Consciousness Alert  SpO2 97 %  O2 Device Nasal Cannula  Assess: MEWS Score  MEWS Temp 0  MEWS Systolic 0  MEWS Pulse 0  MEWS RR 2  MEWS LOC 0  MEWS Score 2  MEWS Score Color Yellow  Assess: if the MEWS score is Yellow or Red  Were vital signs taken at a resting state? Yes  Focused Assessment No change from prior assessment  MEWS guidelines implemented *See Row Information* No, vital signs rechecked  Treat  Pain Scale 0-10  Pain Score 0  Multiple Pain Sites No  Notify: Provider  Provider Name/Title Dr Alcario Drought  Date Provider Notified 04/08/20  Time Provider Notified [phone]  Notification Type Page (when reporting arrival to floor)  Notification Reason Other (Comment) (new admit)  Response See new orders  Date of Provider Response 04/08/20  Time of Provider Response  (Came to assess patient for admission)  Document  Patient Outcome Stabilized after interventions

## 2020-04-08 NOTE — Progress Notes (Signed)
Reports on admission she has not eaten well in last 5 days and has had diarrhea.  Denies nausea.  Noted low K+ .  Will request imodium when seen by MD. Oriented to room and use of call button.  Agrees to report changes in condition. Restful at this time.

## 2020-04-09 LAB — CBC WITH DIFFERENTIAL/PLATELET
Abs Immature Granulocytes: 0.15 10*3/uL — ABNORMAL HIGH (ref 0.00–0.07)
Basophils Absolute: 0 10*3/uL (ref 0.0–0.1)
Basophils Relative: 0 %
Eosinophils Absolute: 0 10*3/uL (ref 0.0–0.5)
Eosinophils Relative: 0 %
HCT: 37.5 % (ref 36.0–46.0)
Hemoglobin: 12.4 g/dL (ref 12.0–15.0)
Immature Granulocytes: 1 %
Lymphocytes Relative: 11 %
Lymphs Abs: 1.9 10*3/uL (ref 0.7–4.0)
MCH: 27.9 pg (ref 26.0–34.0)
MCHC: 33.1 g/dL (ref 30.0–36.0)
MCV: 84.5 fL (ref 80.0–100.0)
Monocytes Absolute: 1 10*3/uL (ref 0.1–1.0)
Monocytes Relative: 6 %
Neutro Abs: 14.6 10*3/uL — ABNORMAL HIGH (ref 1.7–7.7)
Neutrophils Relative %: 82 %
Platelets: 635 10*3/uL — ABNORMAL HIGH (ref 150–400)
RBC: 4.44 MIL/uL (ref 3.87–5.11)
RDW: 14.1 % (ref 11.5–15.5)
WBC: 17.6 10*3/uL — ABNORMAL HIGH (ref 4.0–10.5)
nRBC: 0 % (ref 0.0–0.2)

## 2020-04-09 LAB — D-DIMER, QUANTITATIVE: D-Dimer, Quant: 5.32 ug/mL-FEU — ABNORMAL HIGH (ref 0.00–0.50)

## 2020-04-09 LAB — COMPREHENSIVE METABOLIC PANEL
ALT: 52 U/L — ABNORMAL HIGH (ref 0–44)
AST: 29 U/L (ref 15–41)
Albumin: 3.4 g/dL — ABNORMAL LOW (ref 3.5–5.0)
Alkaline Phosphatase: 75 U/L (ref 38–126)
Anion gap: 12 (ref 5–15)
BUN: 23 mg/dL (ref 8–23)
CO2: 23 mmol/L (ref 22–32)
Calcium: 9.1 mg/dL (ref 8.9–10.3)
Chloride: 106 mmol/L (ref 98–111)
Creatinine, Ser: 0.7 mg/dL (ref 0.44–1.00)
GFR calc Af Amer: >60 mL/min (ref >60)
GFR calc non Af Amer: >60 mL/min (ref >60)
Glucose, Bld: 186 mg/dL — ABNORMAL HIGH (ref 70–99)
Potassium: 3.3 mmol/L — ABNORMAL LOW (ref 3.5–5.1)
Sodium: 141 mmol/L (ref 135–145)
Total Bilirubin: 1 mg/dL (ref 0.3–1.2)
Total Protein: 6.7 g/dL (ref 6.5–8.1)

## 2020-04-09 LAB — PHOSPHORUS: Phosphorus: 3.2 mg/dL (ref 2.5–4.6)

## 2020-04-09 LAB — MAGNESIUM: Magnesium: 2.6 mg/dL — ABNORMAL HIGH (ref 1.7–2.4)

## 2020-04-09 LAB — C-REACTIVE PROTEIN: CRP: 5.4 mg/dL — ABNORMAL HIGH (ref <1.0)

## 2020-04-09 NOTE — Progress Notes (Signed)
PROGRESS NOTE    Amber Grimes  ZSW:[id number] DOB: [birth year] DOA: 04/07/2020 PCP: Elisabeth Cara, PA-C    Brief Narrative:  64 year old female with history of hypertension, brain aneurysm status post clipping, ovarian cancer status post TAH/BSO and admission.  Presented to the ER with shortness of breath and cough started since 9/18. Diagnosed with positive test for COVID-19 on 9/25 at urgent care. In the emergency room, patient was hypoxic and needed 4 L oxygen to keep saturation more than 90%.  CTA shows multifocal pneumonia, negative for PE.  She was started on steroids and remdesivir and admitted.   Assessment & Plan:   Principal Problem:   Acute hypoxemic respiratory failure due to COVID-19 [facility]) Active Problems:   HTN (hypertension)  Acute hypoxemic respiratory failure due to COVID-19 virus infection: Continue to monitor due to significant symptoms, still on supplemental oxygen. chest physiotherapy, incentive spirometry, deep breathing exercises, sputum induction, mucolytic's and bronchodilators. Supplemental oxygen to keep saturations more than 90%. Covid directed therapy with , steroids, on high-dose Solu-Medrol remdesivir, day 3/5 Due to severity of symptoms, patient will need daily inflammatory markers, chest x-rays, liver function test to monitor and direct COVID-19 therapies.  Essential hypertension: On losartan.  Lasix on hold.  DVT prophylaxis: enoxaparin (LOVENOX) injection 40 mg Start: 04/08/20 1000   Code Status: Full code Family Communication: None.  Patient is communicating Disposition Plan: Status is: Inpatient  Remains inpatient appropriate because:Inpatient level of care appropriate due to severity of illness   Dispo: The patient is from: Home              Anticipated d/c is to: Home              Anticipated d/c date is: 2 days              Patient currently is not medically stable to d/c.         Consultants:    None  Procedures:   None  Antimicrobials:  Antibiotics Given (last 72 hours)    Date/Time Action Medication Dose Rate   04/07/20 1713 New Bag/Given   remdesivir 100 mg in sodium chloride 0.9 % 100 mL IVPB 100 mg 200 mL/hr   04/07/20 1745 New Bag/Given   remdesivir 100 mg in sodium chloride 0.9 % 100 mL IVPB 100 mg 200 mL/hr   04/08/20 0929 New Bag/Given   remdesivir 100 mg in sodium chloride 0.9 % 100 mL IVPB 100 mg 200 mL/hr   04/09/20 0947 New Bag/Given   remdesivir 100 mg in sodium chloride 0.9 % 100 mL IVPB 100 mg 200 mL/hr         Subjective: Patient was seen and examined.  No overnight events.  Sitting up in chair and eating breakfast.  Remains on 5 to 6 L of oxygen, however she feels much better and comfortable than before. Eager to work more aggressively on her breathing techniques.  Objective: Vitals:   04/08/20 1737 04/08/20 2030 04/09/20 0524 04/09/20 1339  BP: 129/64 109/69 118/62 (!) 150/60  Pulse: 77 75 63 80  Resp: 18 20 (!) 22 17  Temp: 98.3 F (36.8 C) 98.6 F (37 C) 97.8 F (36.6 C) 98.1 F (36.7 C)  TempSrc: Oral   Oral  SpO2: (!) 89% 97% 95% 100%  Weight:      Height:        Intake/Output Summary (Last 24 hours) at 04/09/2020 1355 Last data filed at 04/09/2020 1254 Gross per 24 hour  Intake 1200  ml  Output --  Net 1200 ml   Filed Weights   04/07/20 1201  Weight: 72.6 kg    Examination:  General exam: Appears calm and comfortable  Respiratory system: Clear to auscultation. Respiratory effort normal.  No added sounds.  Poor respiratory effort.  On 5 L oxygen. Cardiovascular system: S1 & S2 heard, RRR. No JVD, murmurs, rubs, gallops or clicks. No pedal edema. Gastrointestinal system: Abdomen is nondistended, soft and nontender. No organomegaly or masses felt. Normal bowel sounds heard. Central nervous system: Alert and oriented. No focal neurological deficits. Extremities: Symmetric 5 x 5 power. Skin: No rashes, lesions or  ulcers Psychiatry: Judgement and insight appear normal. Mood & affect appropriate.     Data Reviewed: I have personally reviewed following labs and imaging studies  CBC: Recent Labs  Lab 04/07/20 1405 04/08/20 0405 04/09/20 0601  WBC 11.6* 11.4* 17.6*  NEUTROABS 9.2* 9.1* 14.6*  HGB 14.1 13.4 12.4  HCT 42.4 41.0 37.5  MCV 82.5 84.4 84.5  PLT 678* 586* 629*   Basic Metabolic Panel: Recent Labs  Lab 04/07/20 1405 04/08/20 0405 04/09/20 0940  NA 145 143 141  K 2.9* 3.4* 3.3*  CL 102 108 106  CO2 27 21* 23  GLUCOSE 125* 107* 186*  BUN 16 16 23   CREATININE 0.72 0.62 0.70  CALCIUM 9.3 8.9 9.1  MG  --   --  2.6*  PHOS  --   --  3.2   GFR: Estimated Creatinine Clearance: 67.2 mL/min (by C-G formula based on SCr of 0.7 mg/dL). Liver Function Tests: Recent Labs  Lab 04/07/20 1405 04/08/20 0405 04/09/20 0940  AST 53* 37 29  ALT 70* 60* 52*  ALKPHOS 80 79 75  BILITOT 1.3* 1.1 1.0  PROT 7.6 7.0 6.7  ALBUMIN 3.5 3.5 3.4*   No results for input(s): LIPASE, AMYLASE in the last 168 hours. No results for input(s): AMMONIA in the last 168 hours. Coagulation Profile: No results for input(s): INR, PROTIME in the last 168 hours. Cardiac Enzymes: No results for input(s): CKTOTAL, CKMB, CKMBINDEX, TROPONINI in the last 168 hours. BNP (last 3 results) No results for input(s): PROBNP in the last 8760 hours. HbA1C: No results for input(s): HGBA1C in the last 72 hours. CBG: No results for input(s): GLUCAP in the last 168 hours. Lipid Profile: Recent Labs    04/07/20 1405  TRIG 114   Thyroid Function Tests: No results for input(s): TSH, T4TOTAL, FREET4, T3FREE, THYROIDAB in the last 72 hours. Anemia Panel: Recent Labs    04/07/20 1405  FERRITIN 1,511*   Sepsis Labs: Recent Labs  Lab 04/07/20 1405  PROCALCITON <0.10  LATICACIDVEN 1.6    Recent Results (from the past 240 hour(s))  Blood Culture (routine x 2)     Status: None (Preliminary result)   Collection  Time: 04/07/20  1:35 PM   Specimen: BLOOD  Result Value Ref Range Status   Specimen Description   Final    BLOOD RIGHT ANTECUBITAL Performed at [facility], [place] [address] [place], [place] [address]    Special Requests   Final    BOTTLES DRAWN AEROBIC AND ANAEROBIC Blood Culture adequate volume Performed at [facility], [address] [place], [place] [address]    Culture   Final    NO GROWTH 2 DAYS Performed at [facility], [place] [address], [place], [place] [address]    Report Status PENDING  Incomplete  Respiratory Panel by RT PCR (  Flu A&B, Covid) - Nasopharyngeal Swab     Status: Abnormal   Collection Time: 04/07/20  2:05 PM   Specimen: Nasopharyngeal Swab  Result Value Ref Range Status   SARS Coronavirus 2 by RT PCR POSITIVE (A) NEGATIVE Final    Comment: RESULT CALLED TO, READ BACK BY AND VERIFIED WITH: Ilda Basset RN @1524  04/07/2020 OLSONM (NOTE) SARS-CoV-2 target nucleic acids are DETECTED.  SARS-CoV-2 RNA is generally detectable in upper respiratory specimens  during the acute phase of infection. Positive results are indicative of the presence of the identified virus, but do not rule out bacterial infection or co-infection with other pathogens not detected by the test. Clinical correlation with patient history and other diagnostic information is necessary to determine patient infection status. The expected result is Negative.  Fact Sheet for Patients:  PinkCheek.be  Fact Sheet for Healthcare Providers: GravelBags.it  This test is not yet approved or cleared by the [place] FDA and  has been authorized for detection and/or diagnosis of SARS-CoV-2 by FDA under an Emergency Use Authorization (EUA).  This EUA will remain in effect (meaning this test can  be used) for the duration of  the COVID-19 declaration under Section 564(b)(1) of the Act, 21 U.S.C. section 360bbb-3(b)(1),  unless the authorization is terminated or revoked sooner.      Influenza A by PCR NEGATIVE NEGATIVE Final   Influenza B by PCR NEGATIVE NEGATIVE Final    Comment: (NOTE) The Xpert Xpress SARS-CoV-2/FLU/RSV assay is intended as an aid in  the diagnosis of influenza from Nasopharyngeal swab specimens and  should not be used as a sole basis for treatment. Nasal washings and  aspirates are unacceptable for Xpert Xpress SARS-CoV-2/FLU/RSV  testing.  Fact Sheet for Patients: PinkCheek.be  Fact Sheet for Healthcare Providers: GravelBags.it  This test is not yet approved or cleared by the [place] FDA and  has been authorized for detection and/or diagnosis of SARS-CoV-2 by  FDA under an Emergency Use Authorization (EUA). This EUA will remain  in effect (meaning this test can be used) for the duration of the  Covid-19 declaration under Section 564(b)(1) of the Act, 21  U.S.C. section 360bbb-3(b)(1), unless the authorization is  terminated or revoked. Performed at [facility], [address] [place], [place] [address]   Blood Culture (routine x 2)     Status: None (Preliminary result)   Collection Time: 04/08/20  4:05 AM   Specimen: BLOOD RIGHT HAND  Result Value Ref Range Status   Specimen Description   Final    BLOOD RIGHT HAND Performed at [facility] [address], [place], [place] [address]    Special Requests   Final    BOTTLES DRAWN AEROBIC AND ANAEROBIC Blood Culture adequate volume Performed at [facility] [address], [place], [place] [address]    Culture   Final    NO GROWTH 1 DAY Performed at [facility], [place] [address], [place], [place] [address]    Report Status PENDING  Incomplete         Radiology Studies: CT Angio Chest PE W and/or Wo Contrast  Result Date: 04/07/2020 CLINICAL DATA:  Pulmonary embolus suspected. COVID positive  with shortness of breath and positive D-dimer. EXAM: CT ANGIOGRAPHY CHEST WITH CONTRAST TECHNIQUE: Multidetector CT imaging of the chest was performed using the standard protocol during bolus administration of intravenous contrast. Multiplanar CT image reconstructions and MIPs were obtained to evaluate the vascular anatomy.  CONTRAST:  127mL OMNIPAQUE IOHEXOL 350 MG/ML SOLN COMPARISON:  Chest x-ray 04/07/2020. FINDINGS: Cardiovascular: Satisfactory opacification of the pulmonary arteries to the segmental level. No evidence of pulmonary embolism. The main pulmonary artery is normal in caliber. Foci of gas within the main pulmonary artery likely related to injection of intravenous contrast. Normal heart size. Trace pericardial effusion. The thoracic aorta is normal in caliber. Mild aorta atherosclerotic calcification. Mediastinum/Nodes: Prominent borderline enlarged hilar lymph nodes likely reactive. No definite enlarged mediastinal lymph node. No enlarged axillary lymph nodes. Thyroid gland, trachea, and esophagus demonstrate no significant findings. Lungs/Pleura: Diffuse scattered ground-glass airspace opacities that are more consolidative within the lower lung zones. No definite pulmonary mass within the aerated portions of the lungs. Upper Abdomen: No acute abnormality. Musculoskeletal: No chest wall abnormality No suspicious lytic or blastic osseous lesions. No acute displaced fracture. Multilevel degenerative changes of the spine. Review of the MIP images confirms the above findings. IMPRESSION: 1. No pulmonary embolus. 2. Diffuse multifocal pneumonia consistent with known COVID 19 infection. 3. Other imaging findings of potential clinical significance: Likely reactive hilar lymph nodes. Trace pericardial effusion. Aortic Atherosclerosis (ICD10-I70.0). Electronically Signed   By: Iven Finn M.D.   On: 04/07/2020 15:47        Scheduled Meds: . enoxaparin (LOVENOX) injection  40 mg Subcutaneous Q24H   . losartan  100 mg Oral Daily  . methylPREDNISolone (SOLU-MEDROL) injection  0.5 mg/kg Intravenous Q12H   Followed by  . [START ON 04/11/2020] predniSONE  50 mg Oral Daily  . potassium chloride  20 mEq Oral BID   Continuous Infusions: . sodium chloride Stopped (04/08/20 1047)  . remdesivir 100 mg in NS 100 mL 100 mg (04/09/20 0947)     LOS: 1 day    Time spent: 35 minutes    Barb Merino, MD [facility] Pager [phone]

## 2020-04-09 NOTE — Plan of Care (Signed)

## 2020-04-10 LAB — COMPREHENSIVE METABOLIC PANEL WITH GFR
ALT: 47 U/L — ABNORMAL HIGH (ref 0–44)
AST: 28 U/L (ref 15–41)
Albumin: 3 g/dL — ABNORMAL LOW (ref 3.5–5.0)
Alkaline Phosphatase: 59 U/L (ref 38–126)
Anion gap: 10 (ref 5–15)
BUN: 19 mg/dL (ref 8–23)
CO2: 22 mmol/L (ref 22–32)
Calcium: 8.7 mg/dL — ABNORMAL LOW (ref 8.9–10.3)
Chloride: 107 mmol/L (ref 98–111)
Creatinine, Ser: 0.55 mg/dL (ref 0.44–1.00)
GFR calc Af Amer: >60 mL/min
GFR calc non Af Amer: >60 mL/min
Glucose, Bld: 150 mg/dL — ABNORMAL HIGH (ref 70–99)
Potassium: 4.1 mmol/L (ref 3.5–5.1)
Sodium: 139 mmol/L (ref 135–145)
Total Bilirubin: 0.8 mg/dL (ref 0.3–1.2)
Total Protein: 6 g/dL — ABNORMAL LOW (ref 6.5–8.1)

## 2020-04-10 LAB — CBC WITH DIFFERENTIAL/PLATELET
Abs Immature Granulocytes: 0.14 10*3/uL — ABNORMAL HIGH (ref 0.00–0.07)
Basophils Absolute: 0 10*3/uL (ref 0.0–0.1)
Basophils Relative: 0 %
Eosinophils Absolute: 0 10*3/uL (ref 0.0–0.5)
Eosinophils Relative: 0 %
HCT: 37.5 % (ref 36.0–46.0)
Hemoglobin: 12.3 g/dL (ref 12.0–15.0)
Immature Granulocytes: 1 %
Lymphocytes Relative: 12 %
Lymphs Abs: 1.9 10*3/uL (ref 0.7–4.0)
MCH: 27.7 pg (ref 26.0–34.0)
MCHC: 32.8 g/dL (ref 30.0–36.0)
MCV: 84.5 fL (ref 80.0–100.0)
Monocytes Absolute: 0.8 10*3/uL (ref 0.1–1.0)
Monocytes Relative: 5 %
Neutro Abs: 12.8 10*3/uL — ABNORMAL HIGH (ref 1.7–7.7)
Neutrophils Relative %: 82 %
Platelets: 520 10*3/uL — ABNORMAL HIGH (ref 150–400)
RBC: 4.44 MIL/uL (ref 3.87–5.11)
RDW: 13.8 % (ref 11.5–15.5)
WBC: 15.7 10*3/uL — ABNORMAL HIGH (ref 4.0–10.5)
nRBC: 0 % (ref 0.0–0.2)

## 2020-04-10 LAB — C-REACTIVE PROTEIN: CRP: 2.2 mg/dL — ABNORMAL HIGH

## 2020-04-10 LAB — D-DIMER, QUANTITATIVE: D-Dimer, Quant: 3.12 ug{FEU}/mL — ABNORMAL HIGH (ref 0.00–0.50)

## 2020-04-10 NOTE — Progress Notes (Signed)
PROGRESS NOTE    Amber Grimes  NGE:[id number] DOB: 01-29-1956 DOA: 04/07/2020 PCP: Elisabeth Cara, PA-C    Brief Narrative:  64 year old female with history of hypertension, brain aneurysm status post clipping, ovarian cancer status post TAH/BSO and admission.  Presented to the ER with shortness of breath and cough started since 9/18. Diagnosed with positive test for COVID-19 on 9/25 at urgent care. In the emergency room, patient was hypoxic and needed 4 L oxygen to keep saturation more than 90%.  CTA shows multifocal pneumonia, negative for PE.  She was started on steroids and remdesivir and admitted.   Assessment & Plan:   Principal Problem:   Acute hypoxemic respiratory failure due to COVID-19 [facility]) Active Problems:   HTN (hypertension)  Acute hypoxemic respiratory failure due to COVID-19 virus infection: Continue to monitor due to significant symptoms, still on supplemental oxygen. chest physiotherapy, incentive spirometry, deep breathing exercises, sputum induction, mucolytic's and bronchodilators. Supplemental oxygen to keep saturations more than 90%. Covid directed therapy with , steroids, on high-dose Solu-Medrol remdesivir, day 4/5 Due to severity of symptoms, patient will need daily inflammatory markers, chest x-rays, liver function test to monitor and direct COVID-19 therapies. Mobilize and assess for supplemental oxygen need.  Essential hypertension: On losartan.  Lasix on hold.  DVT prophylaxis: enoxaparin (LOVENOX) injection 40 mg Start: 04/08/20 1000   Code Status: Full code Family Communication: None.  Patient is communicating with her family. Disposition Plan: Status is: Inpatient  Remains inpatient appropriate because:Inpatient level of care appropriate due to severity of illness   Dispo: The patient is from: Home              Anticipated d/c is to: Home              Anticipated d/c date is: Architectural technologist.  10/4.              Patient currently is  not medically stable to d/c.         Consultants:   None  Procedures:   None  Antimicrobials:  Antibiotics Given (last 72 hours)    Date/Time Action Medication Dose Rate   04/07/20 1713 New Bag/Given   remdesivir 100 mg in sodium chloride 0.9 % 100 mL IVPB 100 mg 200 mL/hr   04/07/20 1745 New Bag/Given   remdesivir 100 mg in sodium chloride 0.9 % 100 mL IVPB 100 mg 200 mL/hr   04/08/20 0929 New Bag/Given   remdesivir 100 mg in sodium chloride 0.9 % 100 mL IVPB 100 mg 200 mL/hr   04/09/20 0947 New Bag/Given   remdesivir 100 mg in sodium chloride 0.9 % 100 mL IVPB 100 mg 200 mL/hr   04/10/20 1023 New Bag/Given   remdesivir 100 mg in sodium chloride 0.9 % 100 mL IVPB 100 mg 200 mL/hr         Subjective: Patient seen and examined.  No overnight events.  Able to be on room air at complete rest.  Mobility impaired with shortness of breath.  Denies any chest pain or palpitations.  Afebrile overnight.  Objective: Vitals:   04/10/20 0358 04/10/20 0550 04/10/20 0614 04/10/20 1313  BP:  123/65  (!) 152/78  Pulse: (!) 57 (!) 54 (!) 57 78  Resp:  20  16  Temp:  98.1 F (36.7 C)  98.3 F (36.8 C)  TempSrc:    Oral  SpO2: (!) 88% 95% 91% 100%  Weight:      Height:  Intake/Output Summary (Last 24 hours) at 04/10/2020 1336 Last data filed at 04/10/2020 1333 Gross per 24 hour  Intake 1920 ml  Output --  Net 1920 ml   Filed Weights   04/07/20 1201  Weight: 72.6 kg    Examination:  General exam: Appears calm and comfortable  Respiratory system: Clear to auscultation. Respiratory effort normal.  No added sounds.  Poor respiratory effort.  She is on room air at rest.  On 4 L with mobility. Cardiovascular system: S1 & S2 heard, RRR. No JVD, murmurs, rubs, gallops or clicks. No pedal edema. Gastrointestinal system: Abdomen is nondistended, soft and nontender. No organomegaly or masses felt. Normal bowel sounds heard. Central nervous system: Alert and oriented. No  focal neurological deficits. Extremities: Symmetric 5 x 5 power. Skin: No rashes, lesions or ulcers Psychiatry: Judgement and insight appear normal. Mood & affect appropriate.     Data Reviewed: I have personally reviewed following labs and imaging studies  CBC: Recent Labs  Lab 04/07/20 1405 04/08/20 0405 04/09/20 0601 04/10/20 0538  WBC 11.6* 11.4* 17.6* 15.7*  NEUTROABS 9.2* 9.1* 14.6* 12.8*  HGB 14.1 13.4 12.4 12.3  HCT 42.4 41.0 37.5 37.5  MCV 82.5 84.4 84.5 84.5  PLT 678* 586* 635* 532*   Basic Metabolic Panel: Recent Labs  Lab 04/07/20 1405 04/08/20 0405 04/09/20 0940 04/10/20 0538  NA 145 143 141 139  K 2.9* 3.4* 3.3* 4.1  CL 102 108 106 107  CO2 27 21* 23 22  GLUCOSE 125* 107* 186* 150*  BUN 16 16 23 19   CREATININE 0.72 0.62 0.70 0.55  CALCIUM 9.3 8.9 9.1 8.7*  MG  --   --  2.6*  --   PHOS  --   --  3.2  --    GFR: Estimated Creatinine Clearance: 67.2 mL/min (by C-G formula based on SCr of 0.55 mg/dL). Liver Function Tests: Recent Labs  Lab 04/07/20 1405 04/08/20 0405 04/09/20 0940 04/10/20 0538  AST 53* 37 29 28  ALT 70* 60* 52* 47*  ALKPHOS 80 79 75 59  BILITOT 1.3* 1.1 1.0 0.8  PROT 7.6 7.0 6.7 6.0*  ALBUMIN 3.5 3.5 3.4* 3.0*   No results for input(s): LIPASE, AMYLASE in the last 168 hours. No results for input(s): AMMONIA in the last 168 hours. Coagulation Profile: No results for input(s): INR, PROTIME in the last 168 hours. Cardiac Enzymes: No results for input(s): CKTOTAL, CKMB, CKMBINDEX, TROPONINI in the last 168 hours. BNP (last 3 results) No results for input(s): PROBNP in the last 8760 hours. HbA1C: No results for input(s): HGBA1C in the last 72 hours. CBG: No results for input(s): GLUCAP in the last 168 hours. Lipid Profile: Recent Labs    04/07/20 1405  TRIG 114   Thyroid Function Tests: No results for input(s): TSH, T4TOTAL, FREET4, T3FREE, THYROIDAB in the last 72 hours. Anemia Panel: Recent Labs    04/07/20 1405    FERRITIN 1,511*   Sepsis Labs: Recent Labs  Lab 04/07/20 1405  PROCALCITON <0.10  LATICACIDVEN 1.6    Recent Results (from the past 240 hour(s))  Blood Culture (routine x 2)     Status: None (Preliminary result)   Collection Time: 04/07/20  1:35 PM   Specimen: BLOOD  Result Value Ref Range Status   Specimen Description   Final    BLOOD RIGHT ANTECUBITAL Performed at [facility], [place] [address] [place], [place] [address]    Special Requests   Final    BOTTLES  DRAWN AEROBIC AND ANAEROBIC Blood Culture adequate volume Performed at [facility], [address] [place], [place] [address]    Culture   Final    NO GROWTH 3 DAYS Performed at [facility], [place] [address] [place], [place] [address]    Report Status PENDING  Incomplete  Respiratory Panel by RT PCR (Flu A&B, Covid) - Nasopharyngeal Swab     Status: Abnormal   Collection Time: 04/07/20  2:05 PM   Specimen: Nasopharyngeal Swab  Result Value Ref Range Status   SARS Coronavirus 2 by RT PCR POSITIVE (A) NEGATIVE Final    Comment: RESULT CALLED TO, READ BACK BY AND VERIFIED WITH: Ilda Basset RN @1524  04/07/2020 OLSONM (NOTE) SARS-CoV-2 target nucleic acids are DETECTED.  SARS-CoV-2 RNA is generally detectable in upper respiratory specimens  during the acute phase of infection. Positive results are indicative of the presence of the identified virus, but do not rule out bacterial infection or co-infection with other pathogens not detected by the test. Clinical correlation with patient history and other diagnostic information is necessary to determine patient infection status. The expected result is Negative.  Fact Sheet for Patients:  PinkCheek.be  Fact Sheet for Healthcare Providers: GravelBags.it  This test is not yet approved or cleared by the [place] FDA and  has been authorized for detection and/or diagnosis of SARS-CoV-2  by FDA under an Emergency Use Authorization (EUA).  This EUA will remain in effect (meaning this test can  be used) for the duration of  the COVID-19 declaration under Section 564(b)(1) of the Act, 21 U.S.C. section 360bbb-3(b)(1), unless the authorization is terminated or revoked sooner.      Influenza A by PCR NEGATIVE NEGATIVE Final   Influenza B by PCR NEGATIVE NEGATIVE Final    Comment: (NOTE) The Xpert Xpress SARS-CoV-2/FLU/RSV assay is intended as an aid in  the diagnosis of influenza from Nasopharyngeal swab specimens and  should not be used as a sole basis for treatment. Nasal washings and  aspirates are unacceptable for Xpert Xpress SARS-CoV-2/FLU/RSV  testing.  Fact Sheet for Patients: PinkCheek.be  Fact Sheet for Healthcare Providers: GravelBags.it  This test is not yet approved or cleared by the [place] FDA and  has been authorized for detection and/or diagnosis of SARS-CoV-2 by  FDA under an Emergency Use Authorization (EUA). This EUA will remain  in effect (meaning this test can be used) for the duration of the  Covid-19 declaration under Section 564(b)(1) of the Act, 21  U.S.C. section 360bbb-3(b)(1), unless the authorization is  terminated or revoked. Performed at [facility], [address] [place], [place] [address]   Blood Culture (routine x 2)     Status: None (Preliminary result)   Collection Time: 04/08/20  4:05 AM   Specimen: BLOOD RIGHT HAND  Result Value Ref Range Status   Specimen Description   Final    BLOOD RIGHT HAND Performed at [facility] [address] [place], [place] [address]    Special Requests   Final    BOTTLES DRAWN AEROBIC AND ANAEROBIC Blood Culture adequate volume Performed at [facility] [address] [place], [place] [address]    Culture   Final    NO GROWTH 2 DAYS Performed at [facility] [address], [place], [place] [address]    Report Status PENDING  Incomplete         Radiology Studies: No results  found.      Scheduled Meds: . enoxaparin (LOVENOX) injection  40 mg Subcutaneous Q24H  . losartan  100 mg Oral Daily  . methylPREDNISolone (SOLU-MEDROL) injection  0.5 mg/kg Intravenous Q12H   Followed by  . [START ON 04/11/2020] predniSONE  50 mg Oral Daily   Continuous Infusions: . sodium chloride Stopped (04/08/20 1047)  . remdesivir 100 mg in NS 100 mL 100 mg (04/10/20 1023)     LOS: 2 days    Time spent: 35 minutes    Barb Merino, MD [facility] Pager [phone]

## 2020-04-10 NOTE — Progress Notes (Signed)
Pt ambulating in hallway on room air.  Pt ambulated 200+ feet with O2 sats remaining 94%.

## 2020-04-10 NOTE — Plan of Care (Signed)

## 2020-04-11 LAB — COMPREHENSIVE METABOLIC PANEL
ALT: 56 U/L — ABNORMAL HIGH (ref 0–44)
AST: 32 U/L (ref 15–41)
Albumin: 3.1 g/dL — ABNORMAL LOW (ref 3.5–5.0)
Alkaline Phosphatase: 59 U/L (ref 38–126)
Anion gap: 11 (ref 5–15)
BUN: 16 mg/dL (ref 8–23)
CO2: 22 mmol/L (ref 22–32)
Calcium: 8.9 mg/dL (ref 8.9–10.3)
Chloride: 107 mmol/L (ref 98–111)
Creatinine, Ser: 0.57 mg/dL (ref 0.44–1.00)
GFR calc Af Amer: >60 mL/min (ref >60)
GFR calc non Af Amer: >60 mL/min (ref >60)
Glucose, Bld: 151 mg/dL — ABNORMAL HIGH (ref 70–99)
Potassium: 4.3 mmol/L (ref 3.5–5.1)
Sodium: 140 mmol/L (ref 135–145)
Total Bilirubin: 0.9 mg/dL (ref 0.3–1.2)
Total Protein: 6 g/dL — ABNORMAL LOW (ref 6.5–8.1)

## 2020-04-11 LAB — CBC WITH DIFFERENTIAL/PLATELET
Abs Immature Granulocytes: 0.34 10*3/uL — ABNORMAL HIGH (ref 0.00–0.07)
Basophils Absolute: 0 10*3/uL (ref 0.0–0.1)
Basophils Relative: 0 %
Eosinophils Absolute: 0 10*3/uL (ref 0.0–0.5)
Eosinophils Relative: 0 %
HCT: 39.6 % (ref 36.0–46.0)
Hemoglobin: 13.1 g/dL (ref 12.0–15.0)
Immature Granulocytes: 2 %
Lymphocytes Relative: 12 %
Lymphs Abs: 2 10*3/uL (ref 0.7–4.0)
MCH: 27.9 pg (ref 26.0–34.0)
MCHC: 33.1 g/dL (ref 30.0–36.0)
MCV: 84.3 fL (ref 80.0–100.0)
Monocytes Absolute: 1 10*3/uL (ref 0.1–1.0)
Monocytes Relative: 6 %
Neutro Abs: 13.9 10*3/uL — ABNORMAL HIGH (ref 1.7–7.7)
Neutrophils Relative %: 80 %
Platelets: 516 10*3/uL — ABNORMAL HIGH (ref 150–400)
RBC: 4.7 MIL/uL (ref 3.87–5.11)
RDW: 13.9 % (ref 11.5–15.5)
WBC: 17.2 10*3/uL — ABNORMAL HIGH (ref 4.0–10.5)
nRBC: 0 % (ref 0.0–0.2)

## 2020-04-11 LAB — D-DIMER, QUANTITATIVE: D-Dimer, Quant: 3.09 ug/mL-FEU — ABNORMAL HIGH (ref 0.00–0.50)

## 2020-04-11 LAB — C-REACTIVE PROTEIN: CRP: 1.3 mg/dL — ABNORMAL HIGH (ref <1.0)

## 2020-04-11 MED ORDER — ALBUTEROL SULFATE HFA 108 (90 BASE) MCG/ACT IN AERS
2.0000 | INHALATION_SPRAY | RESPIRATORY_TRACT | 0 refills | Status: AC | PRN
Start: 2020-04-11 — End: ?

## 2020-04-11 MED ORDER — PREDNISONE 20 MG PO TABS: 40.0000 mg | ORAL_TABLET | Freq: Every day | ORAL | 0 refills | Status: AC

## 2020-04-11 MED ORDER — ALBUTEROL SULFATE HFA 108 (90 BASE) MCG/ACT IN AERS: 1.0000 | INHALATION_SPRAY | RESPIRATORY_TRACT | 0 refills | Status: DC | PRN

## 2020-04-11 NOTE — Plan of Care (Signed)

## 2020-04-11 NOTE — Discharge Instructions (Signed)
COVID-19 COVID-19 is a respiratory infection that is caused by a virus called severe acute respiratory syndrome coronavirus 2 (SARS-CoV-2). The disease is also known as coronavirus disease or novel coronavirus. In some people, the virus may not cause any symptoms. In others, it may cause a serious infection. The infection can get worse quickly and can lead to complications, such as:  Pneumonia, or infection of the lungs.  Acute respiratory distress syndrome or ARDS. This is a condition in which fluid build-up in the lungs prevents the lungs from filling with air and passing oxygen into the blood.  Acute respiratory failure. This is a condition in which there is not enough oxygen passing from the lungs to the body or when carbon dioxide is not passing from the lungs out of the body.  Sepsis or septic shock. This is a serious bodily reaction to an infection.  Blood clotting problems.  Secondary infections due to bacteria or fungus.  Organ failure. This is when your body's organs stop working. The virus that causes COVID-19 is contagious. This means that it can spread from person to person through droplets from coughs and sneezes (respiratory secretions). What are the causes? This illness is caused by a virus. You may catch the virus by:  Breathing in droplets from an infected person. Droplets can be spread by a person breathing, speaking, singing, coughing, or sneezing.  Touching something, like a table or a doorknob, that was exposed to the virus (contaminated) and then touching your mouth, nose, or eyes. What increases the risk? Risk for infection You are more likely to be infected with this virus if you:  Are within 6 feet (2 meters) of a person with COVID-19.  Provide care for or live with a person who is infected with COVID-19.  Spend time in crowded indoor spaces or live in shared housing. Risk for serious illness You are more likely to become seriously ill from the virus if  you:  Are 50 years of age or older. The higher your age, the more you are at risk for serious illness.  Live in a nursing home or long-term care facility.  Have cancer.  Have a long-term (chronic) disease such as: ? Chronic lung disease, including chronic obstructive pulmonary disease or asthma. ? A long-term disease that lowers your body's ability to fight infection (immunocompromised). ? Heart disease, including heart failure, a condition in which the arteries that lead to the heart become narrow or blocked (coronary artery disease), a disease which makes the heart muscle thick, weak, or stiff (cardiomyopathy). ? Diabetes. ? Chronic kidney disease. ? Sickle cell disease, a condition in which red blood cells have an abnormal "sickle" shape. ? Liver disease.  Are obese. What are the signs or symptoms? Symptoms of this condition can range from mild to severe. Symptoms may appear any time from 2 to 14 days after being exposed to the virus. They include:  A fever or chills.  A cough.  Difficulty breathing.  Headaches, body aches, or muscle aches.  Runny or stuffy (congested) nose.  A sore throat.  New loss of taste or smell. Some people may also have stomach problems, such as nausea, vomiting, or diarrhea. Other people may not have any symptoms of COVID-19. How is this diagnosed? This condition may be diagnosed based on:  Your signs and symptoms, especially if: ? You live in an area with a COVID-19 outbreak. ? You recently traveled to or from an area where the virus is common. ? You   provide care for or live with a person who was diagnosed with COVID-19. ? You were exposed to a person who was diagnosed with COVID-19.  A physical exam.  Lab tests, which may include: ? Taking a sample of fluid from the back of your nose and throat (nasopharyngeal fluid), your nose, or your throat using a swab. ? A sample of mucus from your lungs (sputum). ? Blood tests.  Imaging tests,  which may include, X-rays, CT scan, or ultrasound. How is this treated? At present, there is no medicine to treat COVID-19. Medicines that treat other diseases are being used on a trial basis to see if they are effective against COVID-19. Your health care provider will talk with you about ways to treat your symptoms. For most people, the infection is mild and can be managed at home with rest, fluids, and over-the-counter medicines. Treatment for a serious infection usually takes places in a hospital intensive care unit (ICU). It may include one or more of the following treatments. These treatments are given until your symptoms improve.  Receiving fluids and medicines through an IV.  Supplemental oxygen. Extra oxygen is given through a tube in the nose, a face mask, or a hood.  Positioning you to lie on your stomach (prone position). This makes it easier for oxygen to get into the lungs.  Continuous positive airway pressure (CPAP) or bi-level positive airway pressure (BPAP) machine. This treatment uses mild air pressure to keep the airways open. A tube that is connected to a motor delivers oxygen to the body.  Ventilator. This treatment moves air into and out of the lungs by using a tube that is placed in your windpipe.  Tracheostomy. This is a procedure to create a hole in the neck so that a breathing tube can be inserted.  Extracorporeal membrane oxygenation (ECMO). This procedure gives the lungs a chance to recover by taking over the functions of the heart and lungs. It supplies oxygen to the body and removes carbon dioxide. Follow these instructions at home: Lifestyle  If you are sick, stay home except to get medical care. Your health care provider will tell you how long to stay home. Call your health care provider before you go for medical care.  Rest at home as told by your health care provider.  Do not use any products that contain nicotine or tobacco, such as cigarettes,  e-cigarettes, and chewing tobacco. If you need help quitting, ask your health care provider.  Return to your normal activities as told by your health care provider. Ask your health care provider what activities are safe for you. General instructions  Take over-the-counter and prescription medicines only as told by your health care provider.  Drink enough fluid to keep your urine pale yellow.  Keep all follow-up visits as told by your health care provider. This is important. How is this prevented?  There is no vaccine to help prevent COVID-19 infection. However, there are steps you can take to protect yourself and others from this virus. To protect yourself:   Do not travel to areas where COVID-19 is a risk. The areas where COVID-19 is reported change often. To identify high-risk areas and travel restrictions, check the CDC travel website: wwwnc.cdc.gov/travel/notices  If you live in, or must travel to, an area where COVID-19 is a risk, take precautions to avoid infection. ? Stay away from people who are sick. ? Wash your hands often with soap and water for 20 seconds. If soap and water   are not available, use an alcohol-based hand sanitizer. ? Avoid touching your mouth, face, eyes, or nose. ? Avoid going out in public, follow guidance from your state and local health authorities. ? If you must go out in public, wear a cloth face covering or face mask. Make sure your mask covers your nose and mouth. ? Avoid crowded indoor spaces. Stay at least 6 feet (2 meters) away from others. ? Disinfect objects and surfaces that are frequently touched every day. This may include:  Counters and tables.  Doorknobs and light switches.  Sinks and faucets.  Electronics, such as phones, remote controls, keyboards, computers, and tablets. To protect others: If you have symptoms of COVID-19, take steps to prevent the virus from spreading to others.  If you think you have a COVID-19 infection, contact  your health care provider right away. Tell your health care team that you think you may have a COVID-19 infection.  Stay home. Leave your house only to seek medical care. Do not use public transport.  Do not travel while you are sick.  Wash your hands often with soap and water for 20 seconds. If soap and water are not available, use alcohol-based hand sanitizer.  Stay away from other members of your household. Let healthy household members care for children and pets, if possible. If you have to care for children or pets, wash your hands often and wear a mask. If possible, stay in your own room, separate from others. Use a different bathroom.  Make sure that all people in your household wash their hands well and often.  Cough or sneeze into a tissue or your sleeve or elbow. Do not cough or sneeze into your hand or into the air.  Wear a cloth face covering or face mask. Make sure your mask covers your nose and mouth. Where to find more information  Centers for Disease Control and Prevention: www.cdc.gov/coronavirus/2019-ncov/index.html  World Health Organization: www.who.int/health-topics/coronavirus Contact a health care provider if:  You live in or have traveled to an area where COVID-19 is a risk and you have symptoms of the infection.  You have had contact with someone who has COVID-19 and you have symptoms of the infection. Get help right away if:  You have trouble breathing.  You have pain or pressure in your chest.  You have confusion.  You have bluish lips and fingernails.  You have difficulty waking from sleep.  You have symptoms that get worse. These symptoms may represent a serious problem that is an emergency. Do not wait to see if the symptoms will go away. Get medical help right away. Call your local emergency services (911 in the [place] Do not drive yourself to the hospital. Let the emergency medical personnel know if you think you have  COVID-19. Summary  COVID-19 is a respiratory infection that is caused by a virus. It is also known as coronavirus disease or novel coronavirus. It can cause serious infections, such as pneumonia, acute respiratory distress syndrome, acute respiratory failure, or sepsis.  The virus that causes COVID-19 is contagious. This means that it can spread from person to person through droplets from breathing, speaking, singing, coughing, or sneezing.  You are more likely to develop a serious illness if you are 50 years of age or older, have a weak immune system, live in a nursing home, or have chronic disease.  There is no medicine to treat COVID-19. Your health care provider will talk with you about ways to treat your symptoms.    Take steps to protect yourself and others from infection. Wash your hands often and disinfect objects and surfaces that are frequently touched every day. Stay away from people who are sick and wear a mask if you are sick. This information is not intended to replace advice given to you by your health care provider. Make sure you discuss any questions you have with your health care provider. Document Revised: 04/24/2019 Document Reviewed: 07/31/2018 Elsevier Patient Education  2020 [employer].   COVID-19 Frequently Asked Questions COVID-19 (coronavirus disease) is an infection that is caused by a large family of viruses. Some viruses cause illness in people and others cause illness in animals like camels, cats, and bats. In some cases, the viruses that cause illness in animals can spread to humans. Where did the coronavirus come from? In December 2019, [place] told the [facility]) of several cases of lung disease (human respiratory illness). These cases were linked to an open seafood and livestock market in the city of [place]. The link to the seafood and livestock market suggests that the virus may have spread from animals to humans. However, since that first  outbreak in December, the virus has also been shown to spread from person to person. What is the name of the disease and the virus? Disease name Early on, this disease was called novel coronavirus. This is because scientists determined that the disease was caused by a new (novel) respiratory virus. The World Health Organization [facility]) has now named the disease COVID-19, or coronavirus disease. Virus name The virus that causes the disease is called severe acute respiratory syndrome coronavirus 2 (SARS-CoV-2). More information on disease and virus naming World Health Organization [facility]): www.who.int/emergencies/diseases/novel-coronavirus-2019/technical-guidance/naming-the-coronavirus-disease-(covid-2019)-and-the-virus-that-causes-it Who is at risk for complications from coronavirus disease? Some people may be at higher risk for complications from coronavirus disease. This includes older adults and people who have chronic diseases, such as heart disease, diabetes, and lung disease. If you are at higher risk for complications, take these extra precautions:  Stay home as much as possible.  Avoid social gatherings and travel.  Avoid close contact with others. Stay at least 6 ft (2 m) away from others, if possible.  Wash your hands often with soap and water for at least 20 seconds.  Avoid touching your face, mouth, nose, or eyes.  Keep supplies on hand at home, such as food, medicine, and cleaning supplies.  If you must go out in public, wear a cloth face covering or face mask. Make sure your mask covers your nose and mouth. How does coronavirus disease spread? The virus that causes coronavirus disease spreads easily from person to person (is contagious). You may catch the virus by:  Breathing in droplets from an infected person. Droplets can be spread by a person breathing, speaking, singing, coughing, or sneezing.  Touching something, like a table or a doorknob, that was exposed to the virus  (contaminated) and then touching your mouth, nose, or eyes. Can I get the virus from touching surfaces or objects? There is still a lot that we do not know about the virus that causes coronavirus disease. Scientists are basing a lot of information on what they know about similar viruses, such as:  Viruses cannot generally survive on surfaces for long. They need a human body (host) to survive.  It is more likely that the virus is spread by close contact with people who are sick (direct contact), such as through: ? Shaking hands or hugging. ? Breathing in respiratory droplets  that travel through the air. Droplets can be spread by a person breathing, speaking, singing, coughing, or sneezing.  It is less likely that the virus is spread when a person touches a surface or object that has the virus on it (indirect contact). The virus may be able to enter the body if the person touches a surface or object and then touches his or her face, eyes, nose, or mouth. Can a person spread the virus without having symptoms of the disease? It may be possible for the virus to spread before a person has symptoms of the disease, but this is most likely not the main way the virus is spreading. It is more likely for the virus to spread by being in close contact with people who are sick and breathing in the respiratory droplets spread by a person breathing, speaking, singing, coughing, or sneezing. What are the symptoms of coronavirus disease? Symptoms vary from person to person and can range from mild to severe. Symptoms may include:  Fever or chills.  Cough.  Difficulty breathing or feeling short of breath.  Headaches, body aches, or muscle aches.  Runny or stuffy (congested) nose.  Sore throat.  New loss of taste or smell.  Nausea, vomiting, or diarrhea. These symptoms can appear anywhere from 2 to 14 days after you have been exposed to the virus. Some people may not have any symptoms. If you develop  symptoms, call your health care provider. People with severe symptoms may need hospital care. Should I be tested for this virus? Your health care provider will decide whether to test you based on your symptoms, history of exposure, and your risk factors. How does a health care provider test for this virus? Health care providers will collect samples to send for testing. Samples may include:  Taking a swab of fluid from the back of your nose and throat, your nose, or your throat.  Taking fluid from the lungs by having you cough up mucus (sputum) into a sterile cup.  Taking a blood sample. Is there a treatment or vaccine for this virus? Currently, there is no vaccine to prevent coronavirus disease. Also, there are no medicines like antibiotics or antivirals to treat the virus. A person who becomes sick is given supportive care, which means rest and fluids. A person may also relieve his or her symptoms by using over-the-counter medicines that treat sneezing, coughing, and runny nose. These are the same medicines that a person takes for the common cold. If you develop symptoms, call your health care provider. People with severe symptoms may need hospital care. What can I do to protect myself and my family from this virus?     You can protect yourself and your family by taking the same actions that you would take to prevent the spread of other viruses. Take the following actions:  Wash your hands often with soap and water for at least 20 seconds. If soap and water are not available, use alcohol-based hand sanitizer.  Avoid touching your face, mouth, nose, or eyes.  Cough or sneeze into a tissue, sleeve, or elbow. Do not cough or sneeze into your hand or the air. ? If you cough or sneeze into a tissue, throw it away immediately and wash your hands.  Disinfect objects and surfaces that you frequently touch every day.  Stay away from people who are sick.  Avoid going out in public, follow  guidance from your state and local health authorities.  Avoid crowded indoor spaces.   Stay at least 6 ft (2 m) away from others.  If you must go out in public, wear a cloth face covering or face mask. Make sure your mask covers your nose and mouth.  Stay home if you are sick, except to get medical care. Call your health care provider before you get medical care. Your health care provider will tell you how long to stay home.  Make sure your vaccines are up to date. Ask your health care provider what vaccines you need. What should I do if I need to travel? Follow travel recommendations from your local health authority, the CDC, and WHO. Travel information and advice  Centers for Disease Control and Prevention (CDC): BodyEditor.hu  World Health Organization [facility]): ThirdIncome.ca Know the risks and take action to protect your health  You are at higher risk of getting coronavirus disease if you are traveling to areas with an outbreak or if you are exposed to travelers from areas with an outbreak.  Wash your hands often and practice good hygiene to lower the risk of catching or spreading the virus. What should I do if I am sick? General instructions to stop the spread of infection  Wash your hands often with soap and water for at least 20 seconds. If soap and water are not available, use alcohol-based hand sanitizer.  Cough or sneeze into a tissue, sleeve, or elbow. Do not cough or sneeze into your hand or the air.  If you cough or sneeze into a tissue, throw it away immediately and wash your hands.  Stay home unless you must get medical care. Call your health care provider or local health authority before you get medical care.  Avoid public areas. Do not take public transportation, if possible.  If you can, wear a mask if you must go out of the house or if you are in close contact with someone  who is not sick. Make sure your mask covers your nose and mouth. Keep your home clean  Disinfect objects and surfaces that are frequently touched every day. This may include: ? Counters and tables. ? Doorknobs and light switches. ? Sinks and faucets. ? Electronics such as phones, remote controls, keyboards, computers, and tablets.  Wash dishes in hot, soapy water or use a dishwasher. Air-dry your dishes.  Wash laundry in hot water. Prevent infecting other household members  Let healthy household members care for children and pets, if possible. If you have to care for children or pets, wash your hands often and wear a mask.  Sleep in a different bedroom or bed, if possible.  Do not share personal items, such as razors, toothbrushes, deodorant, combs, brushes, towels, and washcloths. Where to find more information Centers for Disease Control and Prevention (CDC)  Information and news updates: https://www.butler-gonzalez.com/ World Health Organization [facility])  Information and news updates: MissExecutive.com.ee  Coronavirus health topic: https://www.castaneda.info/  Questions and answers on COVID-19: OpportunityDebt.at  Global tracker: who.sprinklr.com American Academy of Pediatrics (AAP)  Information for families: www.healthychildren.org/English/health-issues/conditions/chest-lungs/Pages/2019-Novel-Coronavirus.aspx The coronavirus situation is changing rapidly. Check your local health authority website or the CDC and [facility] websites for updates and news. When should I contact a health care provider?  Contact your health care provider if you have symptoms of an infection, such as fever or cough, and you: ? Have been near anyone who is known to have coronavirus disease. ? Have come into contact with a person who is suspected to have coronavirus disease. ? Have traveled to an area where there is  an outbreak of  COVID-19. When should I get emergency medical care?  Get help right away by calling your local emergency services (911 in the [place] if you have: ? Trouble breathing. ? Pain or pressure in your chest. ? Confusion. ? Blue-tinged lips and fingernails. ? Difficulty waking from sleep. ? Symptoms that get worse. Let the emergency medical personnel know if you think you have coronavirus disease. Summary  A new respiratory virus is spreading from person to person and causing COVID-19 (coronavirus disease).  The virus that causes COVID-19 appears to spread easily. It spreads from one person to another through droplets from breathing, speaking, singing, coughing, or sneezing.  Older adults and those with chronic diseases are at higher risk of disease. If you are at higher risk for complications, take extra precautions.  There is currently no vaccine to prevent coronavirus disease. There are no medicines, such as antibiotics or antivirals, to treat the virus.  You can protect yourself and your family by washing your hands often, avoiding touching your face, and covering your coughs and sneezes. This information is not intended to replace advice given to you by your health care provider. Make sure you discuss any questions you have with your health care provider. Document Revised: 04/24/2019 Document Reviewed: 10/21/2018 Elsevier Patient Education  [place].

## 2020-04-11 NOTE — Discharge Summary (Signed)
Physician Discharge Summary  Kirby Argueta TSV:[id number] DOB: 1955-07-22 DOA: 04/07/2020  PCP: Elisabeth Cara, PA-C  Admit date: 04/07/2020 Discharge date: 04/11/2020  Admitted From: Home Disposition: Home  Recommendations for Outpatient Follow-up:  1. Follow up with PCP in 1-2 weeks  Home Health: Not applicable Equipment/Devices: Not applicable  Discharge Condition: Stable CODE STATUS: Full code Diet recommendation: Low-salt diet  Brief/Interim Summary: 64 year old female with history of hypertension, brain aneurysm status post clipping, ovarian cancer status post surgery presented to the ER with shortness of breath and cough for more than a week.  She was diagnosed with COVID-19 on 9/25 at urgent care and was staying home however she was feeling more shortness of breath so came to the ER. On initial presentation patient was hypoxic and needed 4 L of oxygen to keep saturations more than 90%.  CTA showed multifocal pneumonia, negative for PE.  She was started on steroids and remdesivir and admitted to the hospital.  Treated for acute hypoxemic respiratory failure due to COVID-19 virus infection with symptomatic treatment and COVID-19 directed therapy with high-dose steroids and remdesivir for 5 days.  See did excellent clinical recovery.  Initially requiring supplemental oxygen, however now on room air and mobilized with no hypoxia. With good clinical response, patient is going home, will prescribe 5 more days of prednisone therapy to complete 10 days of steroid therapy. She will continue to do chest exercises and breathing treatments as well as inhalers as needed, over-the-counter cough medications and Tylenol at home and recover at home. She will resume her losartan and Lasix that she takes for hypertension.  Educated and counseling done, patient will benefit with COVID-19 vaccination after her isolation time is over. Due to hospitalization from bilateral pneumonia and hypoxia,  she will need total 3 weeks of isolation precautions.  Discharge Diagnoses:  Principal Problem:   Acute hypoxemic respiratory failure due to COVID-19 [facility]) Active Problems:   HTN (hypertension)    Discharge Instructions  Discharge Instructions    Call MD for:  difficulty breathing, headache or visual disturbances   Complete by: As directed    Call MD for:  temperature >100.4   Complete by: As directed    Diet - low sodium heart healthy   Complete by: As directed    Discharge instructions   Complete by: As directed    Can take over the counter medications, cough medicine and tylenol as needed. Can go back to work on 10/16 if feeling well Encourage you take vaccine after you have finished your isolation period.   Increase activity slowly   Complete by: As directed      Allergies as of 04/11/2020      Reactions   Other Rash   Peanut Oil Rash   Peanut-containing Drug Products Rash      Medication List    STOP taking these medications   AeroEclipse II Nebulizer Misc   albuterol (2.5 MG/3ML) 0.083% nebulizer solution Commonly known as: PROVENTIL Replaced by: albuterol 108 (90 Base) MCG/ACT inhaler   benzonatate 200 MG capsule Commonly known as: TESSALON   budesonide 1 MG/2ML nebulizer solution Commonly known as: PULMICORT     TAKE these medications   albuterol 108 (90 Base) MCG/ACT inhaler Commonly known as: VENTOLIN HFA Inhale 1 puff into the lungs every 4 (four) hours as needed for wheezing or shortness of breath. Replaces: albuterol (2.5 MG/3ML) 0.083% nebulizer solution   furosemide 40 MG tablet Commonly known as: LASIX Take 40 mg by mouth daily as  needed for fluid.   losartan 100 MG tablet Commonly known as: COZAAR Take 100 mg by mouth daily.   predniSONE 20 MG tablet Commonly known as: DELTASONE Take 2 tablets (40 mg total) by mouth daily for 5 days.       Follow-up Information    [place], Ericson, PA-C Follow up in 2 week(s).   Specialty:  Family Medicine Contact information: [address] [place] [address] [phone]              Allergies  Allergen Reactions  . Other Rash  . Peanut Oil Rash  . Peanut-Containing Drug Products Rash    Consultations:  None   Procedures/Studies: CT Angio Chest PE W and/or Wo Contrast  Result Date: 04/07/2020 CLINICAL DATA:  Pulmonary embolus suspected. COVID positive with shortness of breath and positive D-dimer. EXAM: CT ANGIOGRAPHY CHEST WITH CONTRAST TECHNIQUE: Multidetector CT imaging of the chest was performed using the standard protocol during bolus administration of intravenous contrast. Multiplanar CT image reconstructions and MIPs were obtained to evaluate the vascular anatomy. CONTRAST:  16mL OMNIPAQUE IOHEXOL 350 MG/ML SOLN COMPARISON:  Chest x-ray 04/07/2020. FINDINGS: Cardiovascular: Satisfactory opacification of the pulmonary arteries to the segmental level. No evidence of pulmonary embolism. The main pulmonary artery is normal in caliber. Foci of gas within the main pulmonary artery likely related to injection of intravenous contrast. Normal heart size. Trace pericardial effusion. The thoracic aorta is normal in caliber. Mild aorta atherosclerotic calcification. Mediastinum/Nodes: Prominent borderline enlarged hilar lymph nodes likely reactive. No definite enlarged mediastinal lymph node. No enlarged axillary lymph nodes. Thyroid gland, trachea, and esophagus demonstrate no significant findings. Lungs/Pleura: Diffuse scattered ground-glass airspace opacities that are more consolidative within the lower lung zones. No definite pulmonary mass within the aerated portions of the lungs. Upper Abdomen: No acute abnormality. Musculoskeletal: No chest wall abnormality No suspicious lytic or blastic osseous lesions. No acute displaced fracture. Multilevel degenerative changes of the spine. Review of the MIP images confirms the above findings. IMPRESSION: 1. No  pulmonary embolus. 2. Diffuse multifocal pneumonia consistent with known COVID 19 infection. 3. Other imaging findings of potential clinical significance: Likely reactive hilar lymph nodes. Trace pericardial effusion. Aortic Atherosclerosis (ICD10-I70.0). Electronically Signed   By: Iven Finn M.D.   On: 04/07/2020 15:47   DG Chest Port 1 View  Result Date: 04/07/2020 CLINICAL DATA:  Shortness of breath and cough.  COVID-19 positive EXAM: PORTABLE CHEST 1 VIEW COMPARISON:  April 07, 2020 study obtained earlier in the day FINDINGS: There is airspace opacity in each mid and lower lung region, most notable in the bases. No new opacity evident. Heart size and pulmonary vascularity are normal. No adenopathy. No bone lesions. IMPRESSION: Multifocal opacity bilaterally consistent with atypical organism pneumonia. No new opacity compared to earlier in the day. Cardiac silhouette within normal limits. Electronically Signed   By: Lowella Grip III M.D.   On: 04/07/2020 13:52   (Echo, Carotid, EGD, Colonoscopy, ERCP)    Subjective: Patient was seen and examined.  Overnight events.  She is mostly on room air.  She ambulated around and was able to keep up her saturations more than 90%.  Has some tiredness otherwise mostly asymptomatic.   Discharge Exam: Vitals:   04/10/20 1930 04/11/20 0426  BP: 133/65 (!) 153/76  Pulse: 71 67  Resp: 19 19  Temp: 98.2 F (36.8 C) 98.4 F (36.9 C)  SpO2: 96% 96%   Vitals:   04/10/20 1537 04/10/20 1547 04/10/20  1930 04/11/20 0426  BP:   133/65 (!) 153/76  Pulse: 96  71 67  Resp:   19 19  Temp:   98.2 F (36.8 C) 98.4 F (36.9 C)  TempSrc:      SpO2: 93% 94% 96% 96%  Weight:      Height:        General: Pt is alert, awake, not in acute distress Cardiovascular: RRR, S1/S2 +, no rubs, no gallops Respiratory: CTA bilaterally, no wheezing, no rhonchi, no added sounds.  She is on room air. Abdominal: Soft, NT, ND, bowel sounds + Extremities: no  edema, no cyanosis    The results of significant diagnostics from this hospitalization (including imaging, microbiology, ancillary and laboratory) are listed below for reference.     Microbiology: Recent Results (from the past 240 hour(s))  Blood Culture (routine x 2)     Status: None (Preliminary result)   Collection Time: 04/07/20  1:35 PM   Specimen: BLOOD  Result Value Ref Range Status   Specimen Description   Final    BLOOD RIGHT ANTECUBITAL Performed at [facility], [place] [address], [place], [place] [address]    Special Requests   Final    BOTTLES DRAWN AEROBIC AND ANAEROBIC Blood Culture adequate volume Performed at [facility], [place] [place], [place] [address]    Culture   Final    NO GROWTH 4 DAYS Performed at [facility], [place] [address] [place], [place] [address]    Report Status PENDING  Incomplete  Respiratory Panel by RT PCR (Flu A&B, Covid) - Nasopharyngeal Swab     Status: Abnormal   Collection Time: 04/07/20  2:05 PM   Specimen: Nasopharyngeal Swab  Result Value Ref Range Status   SARS Coronavirus 2 by RT PCR POSITIVE (A) NEGATIVE Final    Comment: RESULT CALLED TO, READ BACK BY AND VERIFIED WITH: Ilda Basset RN @1524  04/07/2020 OLSONM (NOTE) SARS-CoV-2 target nucleic acids are DETECTED.  SARS-CoV-2 RNA is generally detectable in upper respiratory specimens  during the acute phase of infection. Positive results are indicative of the presence of the identified virus, but do not rule out bacterial infection or co-infection with other pathogens not detected by the test. Clinical correlation with patient history and other diagnostic information is necessary to determine patient infection status. The expected result is Negative.  Fact Sheet for Patients:  PinkCheek.be  Fact Sheet for Healthcare Providers: GravelBags.it  This test is not yet approved or cleared by  the [place] FDA and  has been authorized for detection and/or diagnosis of SARS-CoV-2 by FDA under an Emergency Use Authorization (EUA).  This EUA will remain in effect (meaning this test can  be used) for the duration of  the COVID-19 declaration under Section 564(b)(1) of the Act, 21 U.S.C. section 360bbb-3(b)(1), unless the authorization is terminated or revoked sooner.      Influenza A by PCR NEGATIVE NEGATIVE Final   Influenza B by PCR NEGATIVE NEGATIVE Final    Comment: (NOTE) The Xpert Xpress SARS-CoV-2/FLU/RSV assay is intended as an aid in  the diagnosis of influenza from Nasopharyngeal swab specimens and  should not be used as a sole basis for treatment. Nasal washings and  aspirates are unacceptable for Xpert Xpress SARS-CoV-2/FLU/RSV  testing.  Fact Sheet for Patients: PinkCheek.be  Fact Sheet for Healthcare Providers: GravelBags.it  This test is not yet approved or cleared by the [place] and  has been authorized  for detection and/or diagnosis of SARS-CoV-2 by  FDA under an Emergency Use Authorization (EUA). This EUA will remain  in effect (meaning this test can be used) for the duration of the  Covid-19 declaration under Section 564(b)(1) of the Act, 21  U.S.C. section 360bbb-3(b)(1), unless the authorization is  terminated or revoked. Performed at [facility], [address] [place], [place] [address]   Blood Culture (routine x 2)     Status: None (Preliminary result)   Collection Time: 04/08/20  4:05 AM   Specimen: BLOOD RIGHT HAND  Result Value Ref Range Status   Specimen Description   Final    BLOOD RIGHT HAND Performed at [facility] [address] [place], [place] [address]    Special Requests   Final    BOTTLES DRAWN AEROBIC AND ANAEROBIC Blood Culture adequate volume Performed at [facility] [address], [place],  [place] [address]    Culture   Final    NO GROWTH 3 DAYS Performed at [facility], [place] [address] [place], [place] [address]    Report Status PENDING  Incomplete     Labs: BNP (last 3 results) No results for input(s): BNP in the last 8760 hours. Basic Metabolic Panel: Recent Labs  Lab 04/07/20 1405 04/08/20 0405 04/09/20 0940 04/10/20 0538 04/11/20 0448  NA 145 143 141 139 140  K 2.9* 3.4* 3.3* 4.1 4.3  CL 102 108 106 107 107  CO2 27 21* 23 22 22   GLUCOSE 125* 107* 186* 150* 151*  BUN 16 16 23 19 16   CREATININE 0.72 0.62 0.70 0.55 0.57  CALCIUM 9.3 8.9 9.1 8.7* 8.9  MG  --   --  2.6*  --   --   PHOS  --   --  3.2  --   --    Liver Function Tests: Recent Labs  Lab 04/07/20 1405 04/08/20 0405 04/09/20 0940 04/10/20 0538 04/11/20 0448  AST 53* 37 29 28 32  ALT 70* 60* 52* 47* 56*  ALKPHOS 80 79 75 59 59  BILITOT 1.3* 1.1 1.0 0.8 0.9  PROT 7.6 7.0 6.7 6.0* 6.0*  ALBUMIN 3.5 3.5 3.4* 3.0* 3.1*   No results for input(s): LIPASE, AMYLASE in the last 168 hours. No results for input(s): AMMONIA in the last 168 hours. CBC: Recent Labs  Lab 04/07/20 1405 04/08/20 0405 04/09/20 0601 04/10/20 0538 04/11/20 0448  WBC 11.6* 11.4* 17.6* 15.7* 17.2*  NEUTROABS 9.2* 9.1* 14.6* 12.8* 13.9*  HGB 14.1 13.4 12.4 12.3 13.1  HCT 42.4 41.0 37.5 37.5 39.6  MCV 82.5 84.4 84.5 84.5 84.3  PLT 678* 586* 635* 520* 516*   Cardiac Enzymes: No results for input(s): CKTOTAL, CKMB, CKMBINDEX, TROPONINI in the last 168 hours. BNP: Invalid input(s): POCBNP CBG: No results for input(s): GLUCAP in the last 168 hours. D-Dimer Recent Labs    04/10/20 0538 04/11/20 0448  DDIMER 3.12* 3.09*   Hgb A1c No results for input(s): HGBA1C in the last 72 hours. Lipid Profile No results for input(s): CHOL, HDL, LDLCALC, TRIG, CHOLHDL, LDLDIRECT in the last 72 hours. Thyroid function studies No results for input(s): TSH, T4TOTAL, T3FREE, THYROIDAB in the last 72 hours.  Invalid input(s):  FREET3 Anemia work up No results for input(s): VITAMINB12, FOLATE, FERRITIN, TIBC, IRON, RETICCTPCT in the last 72 hours. Urinalysis No results found for: COLORURINE, APPEARANCEUR, LABSPEC, PHURINE, GLUCOSEU, HGBUR, BILIRUBINUR, KETONESUR, PROTEINUR, UROBILINOGEN, NITRITE, LEUKOCYTESUR Sepsis Labs Invalid input(s): PROCALCITONIN,  WBC,  LACTICIDVEN Microbiology Recent Results (from the past 240 hour(s))  Blood Culture (routine x 2)     Status: None (Preliminary result)   Collection Time: 04/07/20  1:35 PM   Specimen: BLOOD  Result Value Ref Range Status   Specimen Description   Final    BLOOD RIGHT ANTECUBITAL Performed at [facility], [place] [address], [place], [place] [address]    Special Requests   Final    BOTTLES DRAWN AEROBIC AND ANAEROBIC Blood Culture adequate volume Performed at [facility], [place] [place], [place] [address]    Culture   Final    NO GROWTH 4 DAYS Performed at [facility], [place] [address] [place], [place] [address]    Report Status PENDING  Incomplete  Respiratory Panel by RT PCR (Flu A&B, Covid) - Nasopharyngeal Swab     Status: Abnormal   Collection Time: 04/07/20  2:05 PM   Specimen: Nasopharyngeal Swab  Result Value Ref Range Status   SARS Coronavirus 2 by RT PCR POSITIVE (A) NEGATIVE Final    Comment: RESULT CALLED TO, READ BACK BY AND VERIFIED WITH: RUTH MARCUM RN @1524  04/07/2020 OLSONM (NOTE) SARS-CoV-2 target nucleic acids are DETECTED.  SARS-CoV-2 RNA is generally detectable in upper respiratory specimens  during the acute phase of infection. Positive results are indicative of the presence of the identified virus, but do not rule out bacterial infection or co-infection with other pathogens not detected by the test. Clinical correlation with patient history and other diagnostic information is necessary to determine patient infection status. The expected result is Negative.  Fact Sheet for Patients:   PinkCheek.be  Fact Sheet for Healthcare Providers: GravelBags.it  This test is not yet approved or cleared by the [place] FDA and  has been authorized for detection and/or diagnosis of SARS-CoV-2 by FDA under an Emergency Use Authorization (EUA).  This EUA will remain in effect (meaning this test can  be used) for the duration of  the COVID-19 declaration under Section 564(b)(1) of the Act, 21 U.S.C. section 360bbb-3(b)(1), unless the authorization is terminated or revoked sooner.      Influenza A by PCR NEGATIVE NEGATIVE Final   Influenza B by PCR NEGATIVE NEGATIVE Final    Comment: (NOTE) The Xpert Xpress SARS-CoV-2/FLU/RSV assay is intended as an aid in  the diagnosis of influenza from Nasopharyngeal swab specimens and  should not be used as a sole basis for treatment. Nasal washings and  aspirates are unacceptable for Xpert Xpress SARS-CoV-2/FLU/RSV  testing.  Fact Sheet for Patients: PinkCheek.be  Fact Sheet for Healthcare Providers: GravelBags.it  This test is not yet approved or cleared by the [place] FDA and  has been authorized for detection and/or diagnosis of SARS-CoV-2 by  FDA under an Emergency Use Authorization (EUA). This EUA will remain  in effect (meaning this test can be used) for the duration of the  Covid-19 declaration under Section 564(b)(1) of the Act, 21  U.S.C. section 360bbb-3(b)(1), unless the authorization is  terminated or revoked. Performed at [facility], [place] [place], [place] [address]   Blood Culture (routine x 2)     Status: None (Preliminary result)   Collection Time: 04/08/20  4:05 AM   Specimen: BLOOD RIGHT HAND  Result Value Ref Range Status   Specimen Description   Final    BLOOD RIGHT HAND Performed at [facility] [address] [place],  [address]  Special Requests   Final    BOTTLES DRAWN AEROBIC AND ANAEROBIC Blood Culture adequate volume Performed at [facility] [address] [place], [place] [address]    Culture   Final    NO GROWTH 3 DAYS Performed at [facility], [place] [address] [place], [place] [address]    Report Status PENDING  Incomplete     Time coordinating discharge: 40 minutes  SIGNED:   Barb Merino, MD  [facility] 04/11/2020, 2:36 PM

## 2020-04-11 NOTE — Progress Notes (Signed)
Discharge instructions given with stated understanding.  Patient waiting for transportation home at this time 

## 2020-04-12 ENCOUNTER — Other Ambulatory Visit: Payer: Self-pay | Admitting: Internal Medicine

## 2020-04-12 LAB — CULTURE, BLOOD (ROUTINE X 2)
Culture: NO GROWTH
Special Requests: ADEQUATE

## 2020-04-13 LAB — CULTURE, BLOOD (ROUTINE X 2)
Culture: NO GROWTH
Special Requests: ADEQUATE

## 2021-05-26 IMAGING — CT CT ANGIO CHEST
2 of 8 series · 18 of 36 positions shown · IV contrast (Omnipaque)
Comparison: Chest x-ray 04/07/2020.

CLINICAL DATA: Pulmonary embolus suspected. COVID positive with
shortness of breath and positive D-dimer.

EXAM:
CT ANGIOGRAPHY CHEST WITH CONTRAST
TECHNIQUE: Multidetector CT imaging of the chest was performed using the
standard protocol during bolus administration of intravenous
contrast. Multiplanar CT image reconstructions and MIPs were
obtained to evaluate the vascular anatomy.
CONTRAST:  100mL OMNIPAQUE IOHEXOL 350 MG/ML SOLN

[Series 6: pe thins · axial · 0.64mm/px · z∈[-192,+12]mm · 17 of 228 slices shown]
[im 12/228  lung]
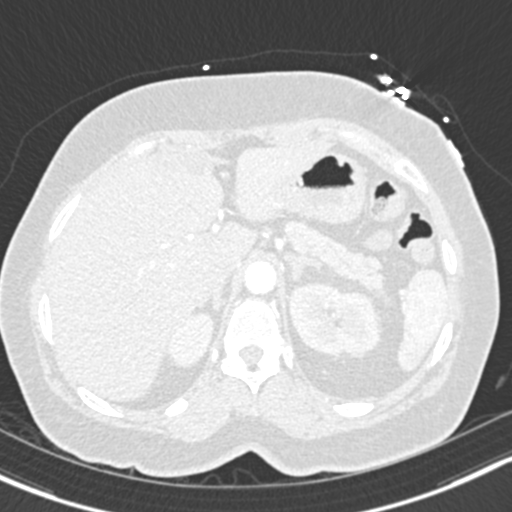
[im 24/228  mediastinal]
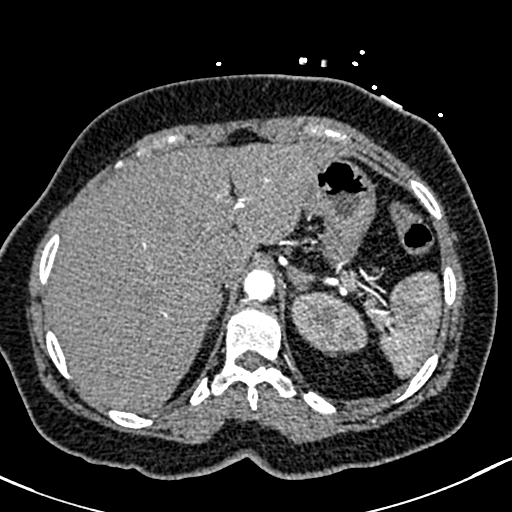
[im 36/228  lung]
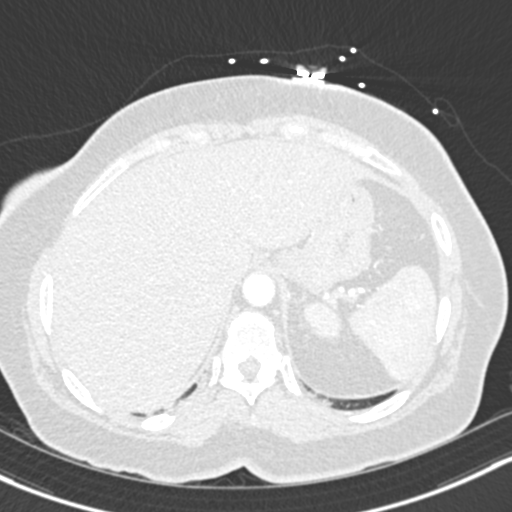
[im 48/228  mediastinal]
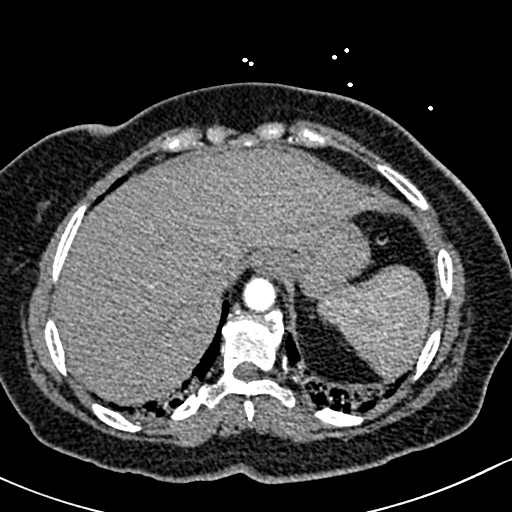
[im 60/228  lung]
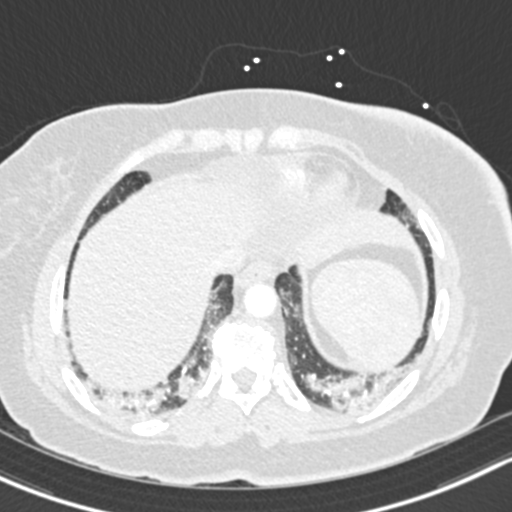
[im 72/228  mediastinal]
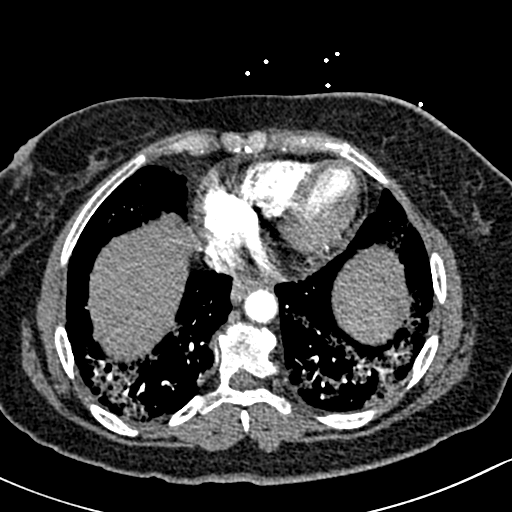
[im 84/228  lung]
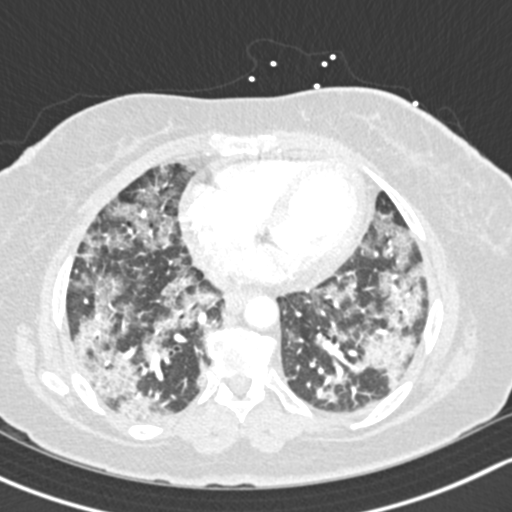
[im 96/228  mediastinal]
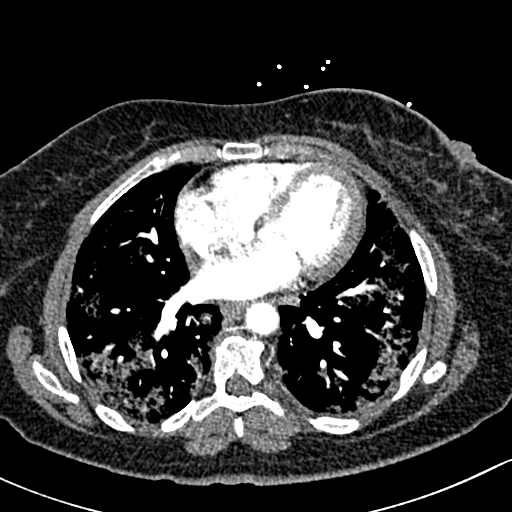
[im 120/228  lung]
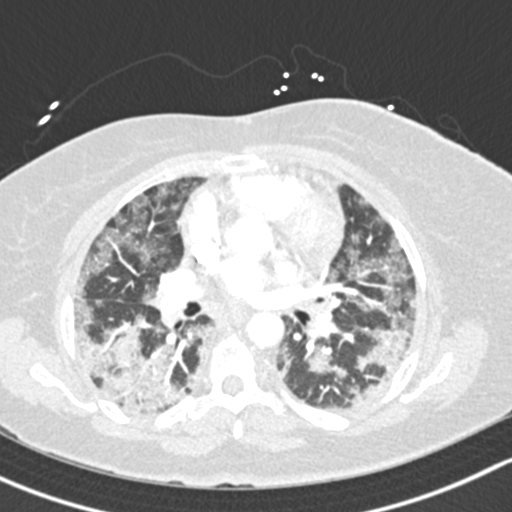
[im 132/228  mediastinal]
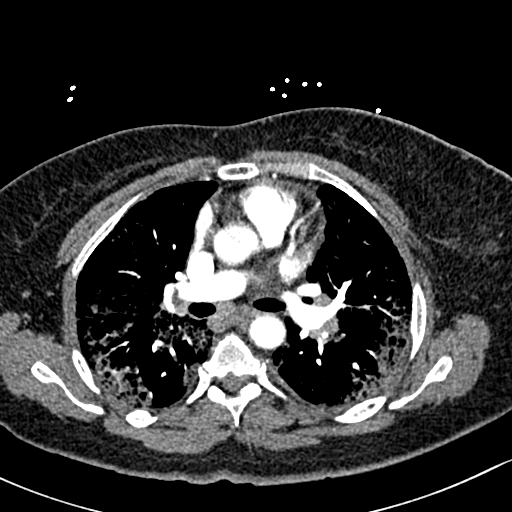
[im 144/228  lung]
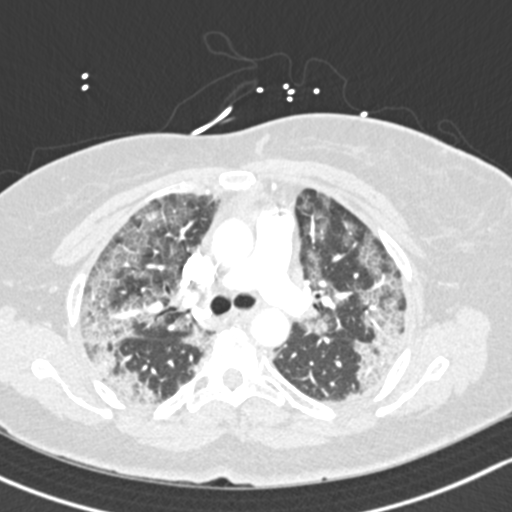
[im 156/228  mediastinal]
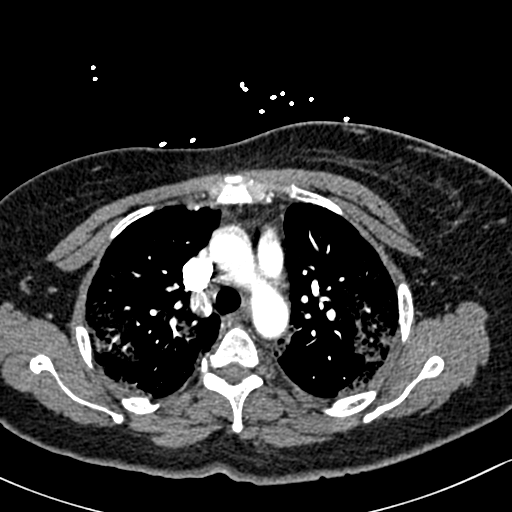
[im 168/228  lung]
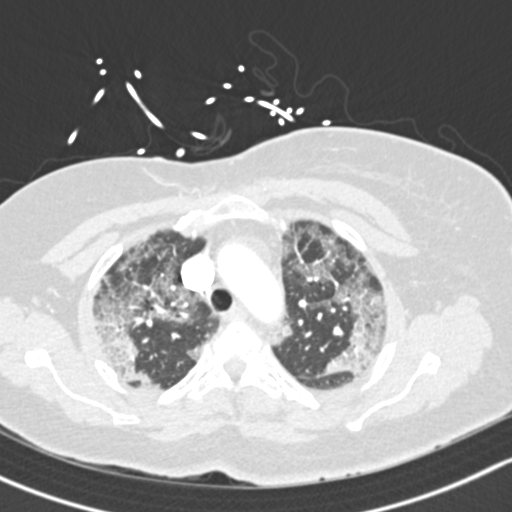
[im 180/228  mediastinal]
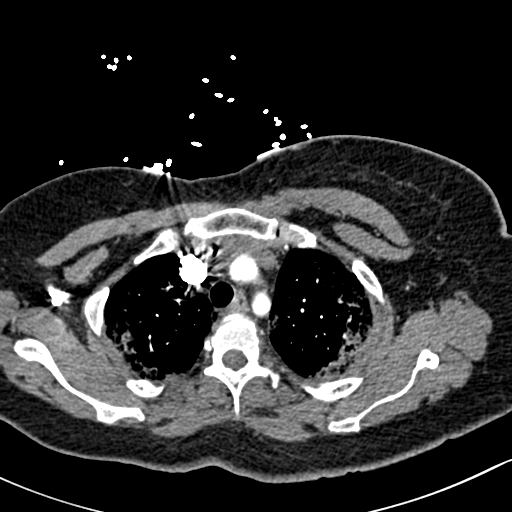
[im 192/228  lung]
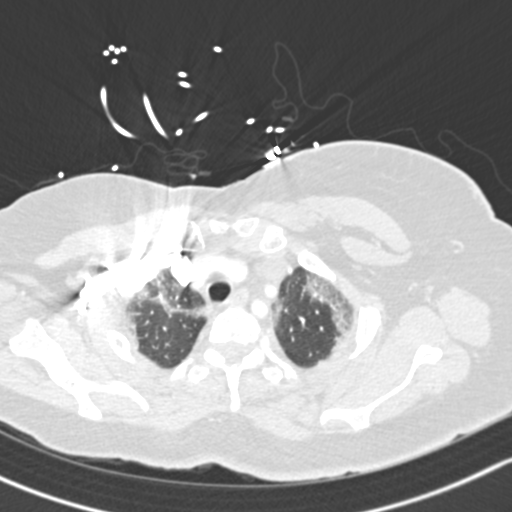
[im 204/228  mediastinal]
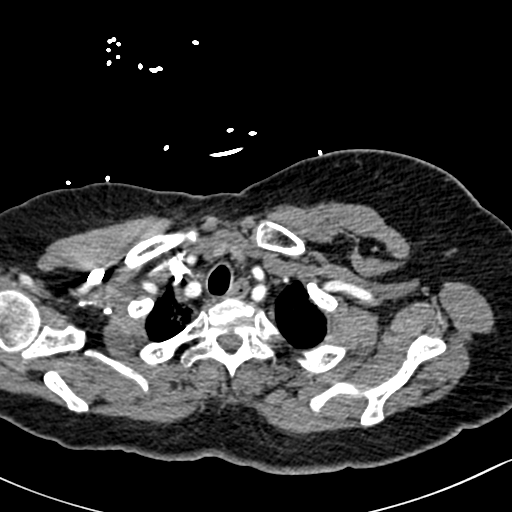
[im 216/228  lung]
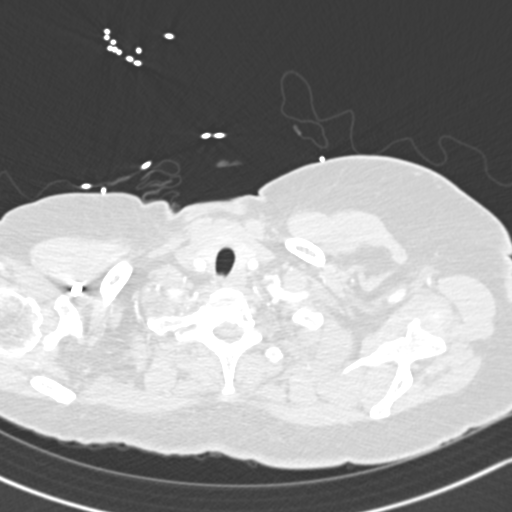

[Series 7: pe coronal mpr · coronal · 0.48mm/px · 1 of 111 slices shown]
[im 56/111  mediastinal]
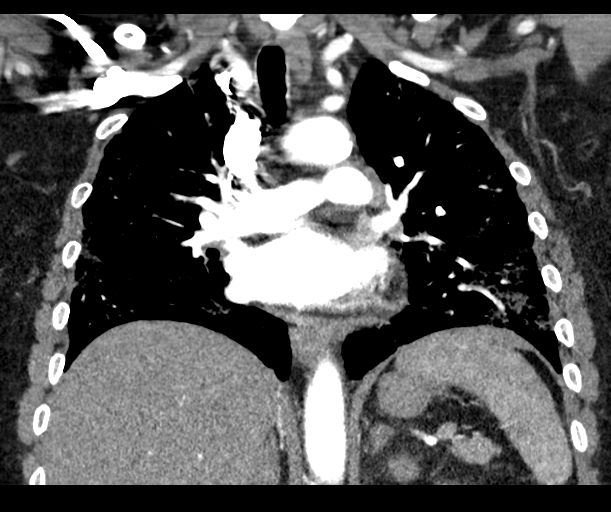

[18 of 36 positions shown; findings below may reference images not displayed]

FINDINGS: Cardiovascular: Satisfactory opacification of the pulmonary arteries
to the segmental level. No evidence of pulmonary embolism. The main
pulmonary artery is normal in caliber. Foci of gas within the main
pulmonary artery likely related to injection of intravenous
contrast. Normal heart size. Trace pericardial effusion. The
thoracic aorta is normal in caliber. Mild aorta atherosclerotic
calcification.

Mediastinum/Nodes: Prominent borderline enlarged hilar lymph nodes
likely reactive. No definite enlarged mediastinal lymph node. No
enlarged axillary lymph nodes. Thyroid gland, trachea, and esophagus
demonstrate no significant findings.

Lungs/Pleura: Diffuse scattered ground-glass airspace opacities that
are more consolidative within the lower lung zones. No definite
pulmonary mass within the aerated portions of the lungs.

Upper Abdomen: No acute abnormality.

Musculoskeletal:

No chest wall abnormality

No suspicious lytic or blastic osseous lesions. No acute displaced
fracture. Multilevel degenerative changes of the spine.

Review of the MIP images confirms the above findings.
IMPRESSION: 1. No pulmonary embolus.
2. Diffuse multifocal pneumonia consistent with known COVID 19
infection.
3. Other imaging findings of potential clinical significance: Likely
reactive hilar lymph nodes. Trace pericardial effusion. Aortic
Atherosclerosis ([id number]).
